# Patient Record
Sex: Female | Born: 1935 | Race: White | Hispanic: No | Smoking: Never smoker
Health system: Southern US, Community
[De-identification: ages and names within clinical notes are randomized; demographics above are authoritative.]

## PROBLEM LIST (undated history)

## (undated) ENCOUNTER — Emergency Department (HOSPITAL_BASED_OUTPATIENT_CLINIC_OR_DEPARTMENT_OTHER): Admission: EM

## (undated) DIAGNOSIS — I10 Essential (primary) hypertension: Secondary | ICD-10-CM

## (undated) DIAGNOSIS — Z98811 Dental restoration status: Secondary | ICD-10-CM

## (undated) DIAGNOSIS — E039 Hypothyroidism, unspecified: Secondary | ICD-10-CM

## (undated) DIAGNOSIS — Z973 Presence of spectacles and contact lenses: Secondary | ICD-10-CM

## (undated) DIAGNOSIS — I499 Cardiac arrhythmia, unspecified: Secondary | ICD-10-CM

## (undated) DIAGNOSIS — M1990 Unspecified osteoarthritis, unspecified site: Secondary | ICD-10-CM

## (undated) HISTORY — PX: EYE SURGERY: SHX253

---

## 1997-09-20 ENCOUNTER — Other Ambulatory Visit: Admission: RE | Admit: 1997-09-20 | Discharge: 1997-09-20 | Payer: Self-pay | Admitting: *Deleted

## 1997-11-08 ENCOUNTER — Ambulatory Visit (HOSPITAL_COMMUNITY): Admission: RE | Admit: 1997-11-08 | Discharge: 1997-11-08 | Payer: Self-pay | Admitting: *Deleted

## 1998-08-10 ENCOUNTER — Emergency Department (HOSPITAL_COMMUNITY): Admission: EM | Admit: 1998-08-10 | Discharge: 1998-08-10 | Payer: Self-pay | Admitting: Emergency Medicine

## 1998-09-25 ENCOUNTER — Other Ambulatory Visit: Admission: RE | Admit: 1998-09-25 | Discharge: 1998-09-25 | Payer: Self-pay | Admitting: *Deleted

## 1999-11-02 ENCOUNTER — Other Ambulatory Visit: Admission: RE | Admit: 1999-11-02 | Discharge: 1999-11-02 | Payer: Self-pay | Admitting: *Deleted

## 2000-08-21 ENCOUNTER — Ambulatory Visit (HOSPITAL_COMMUNITY): Admission: RE | Admit: 2000-08-21 | Discharge: 2000-08-21 | Payer: Self-pay | Admitting: *Deleted

## 2000-08-21 ENCOUNTER — Encounter (INDEPENDENT_AMBULATORY_CARE_PROVIDER_SITE_OTHER): Payer: Self-pay | Admitting: Specialist

## 2000-10-10 ENCOUNTER — Emergency Department (HOSPITAL_COMMUNITY): Admission: EM | Admit: 2000-10-10 | Discharge: 2000-10-10 | Payer: Self-pay | Admitting: Emergency Medicine

## 2000-12-03 ENCOUNTER — Other Ambulatory Visit: Admission: RE | Admit: 2000-12-03 | Discharge: 2000-12-03 | Payer: Self-pay | Admitting: *Deleted

## 2001-05-26 ENCOUNTER — Emergency Department (HOSPITAL_COMMUNITY): Admission: EM | Admit: 2001-05-26 | Discharge: 2001-05-26 | Payer: Self-pay | Admitting: *Deleted

## 2001-12-17 ENCOUNTER — Other Ambulatory Visit: Admission: RE | Admit: 2001-12-17 | Discharge: 2001-12-17 | Payer: Self-pay | Admitting: *Deleted

## 2002-08-02 ENCOUNTER — Emergency Department (HOSPITAL_COMMUNITY): Admission: EM | Admit: 2002-08-02 | Discharge: 2002-08-02 | Payer: Self-pay | Admitting: Emergency Medicine

## 2003-07-20 ENCOUNTER — Emergency Department (HOSPITAL_COMMUNITY): Admission: EM | Admit: 2003-07-20 | Discharge: 2003-07-20 | Payer: Self-pay | Admitting: Emergency Medicine

## 2003-08-02 ENCOUNTER — Emergency Department (HOSPITAL_COMMUNITY): Admission: EM | Admit: 2003-08-02 | Discharge: 2003-08-02 | Payer: Self-pay | Admitting: Emergency Medicine

## 2005-04-01 HISTORY — PX: TOTAL HIP ARTHROPLASTY: SHX124

## 2005-10-07 ENCOUNTER — Ambulatory Visit (HOSPITAL_COMMUNITY): Admission: RE | Admit: 2005-10-07 | Discharge: 2005-10-07 | Payer: Self-pay | Admitting: *Deleted

## 2006-01-09 ENCOUNTER — Inpatient Hospital Stay (HOSPITAL_COMMUNITY): Admission: RE | Admit: 2006-01-09 | Discharge: 2006-01-12 | Payer: Self-pay | Admitting: Orthopedic Surgery

## 2006-03-18 ENCOUNTER — Encounter: Admission: RE | Admit: 2006-03-18 | Discharge: 2006-03-18 | Payer: Self-pay | Admitting: Internal Medicine

## 2006-04-01 HISTORY — PX: TOTAL HIP ARTHROPLASTY: SHX124

## 2006-04-15 ENCOUNTER — Inpatient Hospital Stay (HOSPITAL_COMMUNITY): Admission: RE | Admit: 2006-04-15 | Discharge: 2006-04-19 | Payer: Self-pay | Admitting: Orthopedic Surgery

## 2006-11-14 ENCOUNTER — Encounter: Admission: RE | Admit: 2006-11-14 | Discharge: 2006-11-14 | Payer: Self-pay | Admitting: Orthopaedic Surgery

## 2007-03-23 ENCOUNTER — Encounter: Admission: RE | Admit: 2007-03-23 | Discharge: 2007-03-23 | Payer: Self-pay | Admitting: Internal Medicine

## 2007-04-02 HISTORY — PX: LUMBAR FUSION: SHX111

## 2008-01-14 IMAGING — CR DG CHEST 2V
3 series · 3 of 3 positions shown · non-contrast
Comparison: 08/02/03.

CLINICAL DATA: Scoliosis right hip.  Preop workup. 
 CHEST ?2 VIEW:

[view not recorded (1 of 3)]
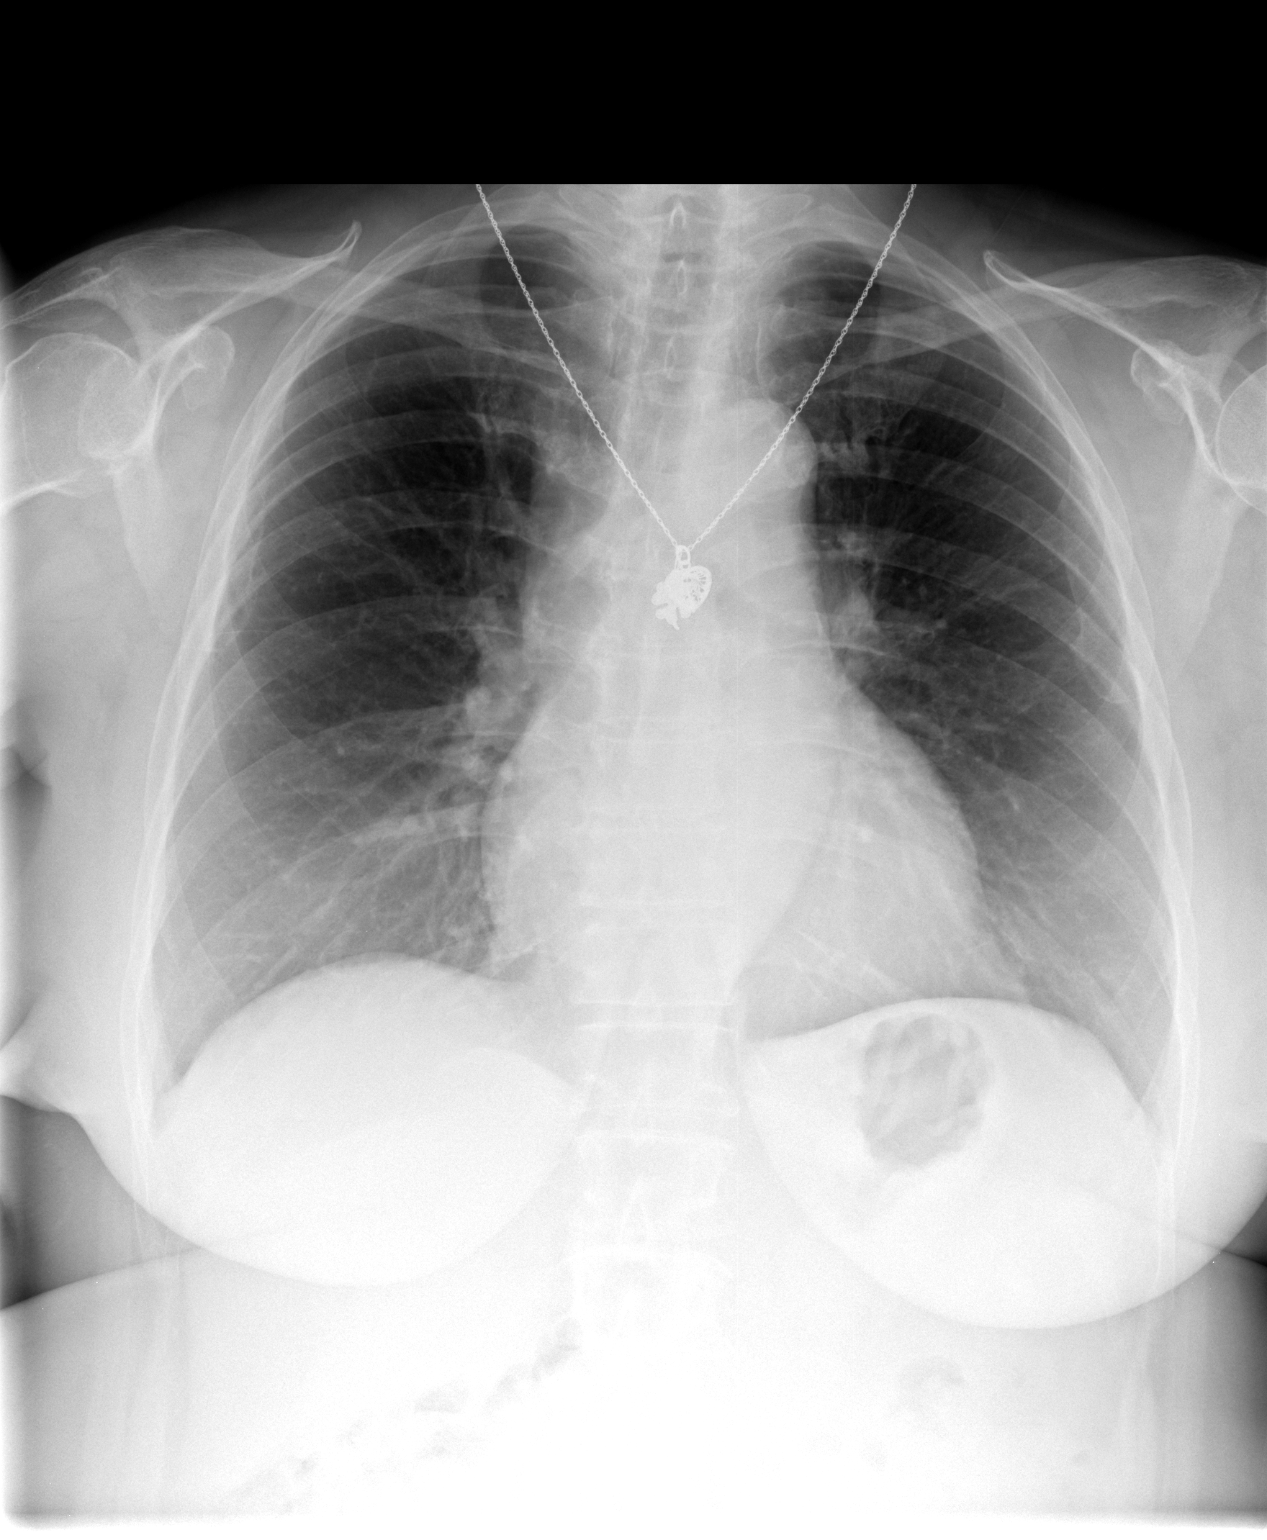

[view not recorded (2 of 3)]
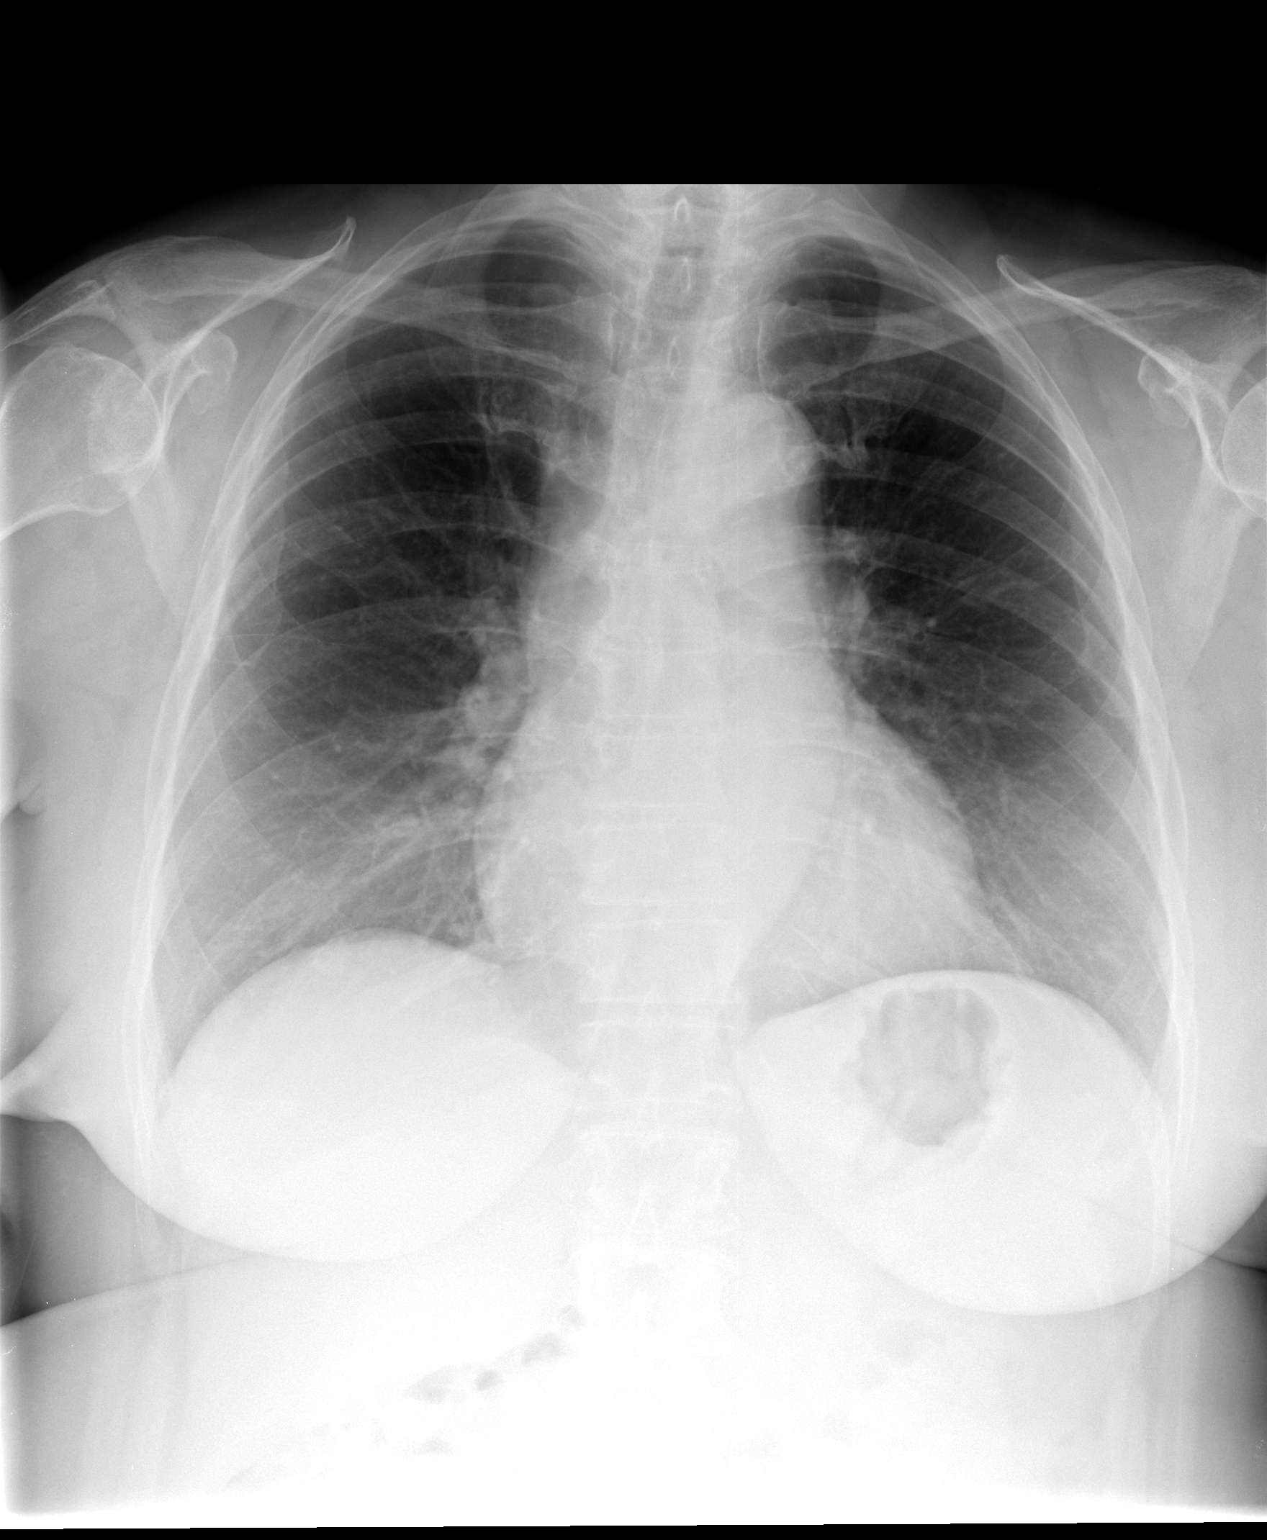

[view not recorded (3 of 3)]
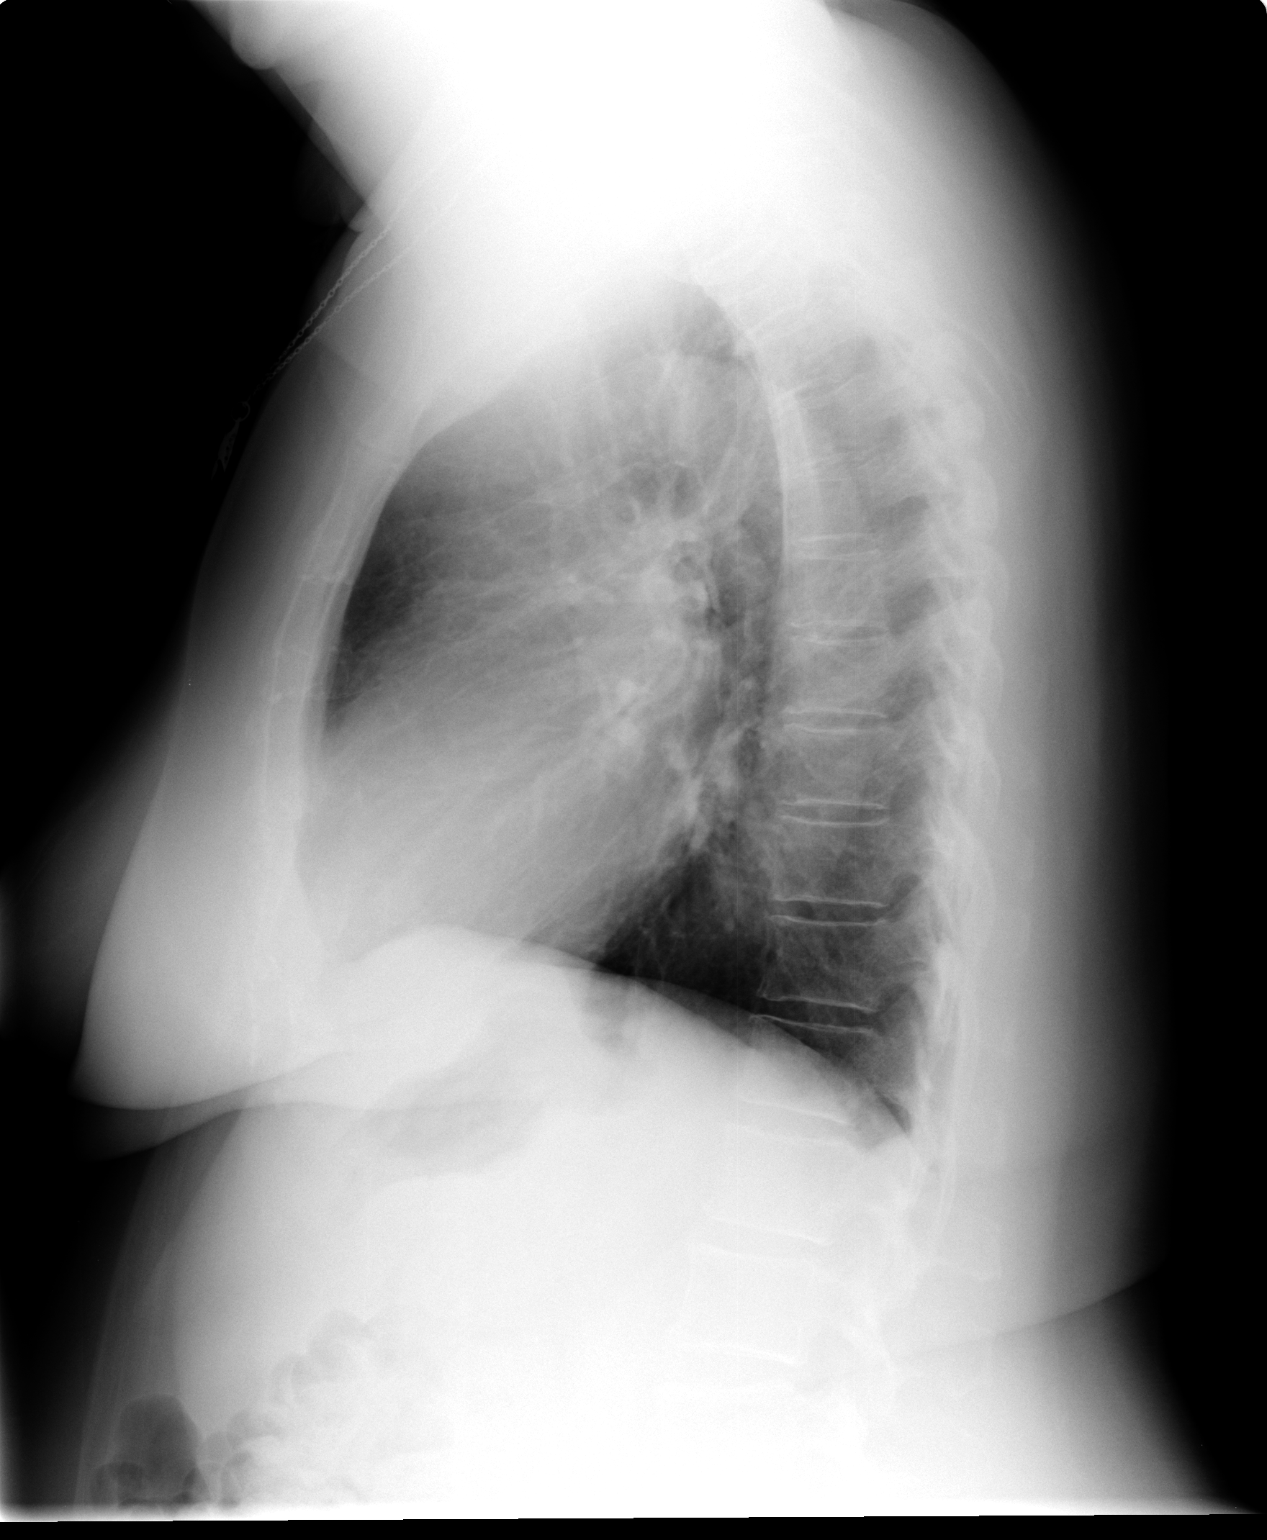

[3 of 3 positions shown; findings below may reference images not displayed]

FINDINGS: The heart size and mediastinal contours are within normal limits.  Both lungs are clear.  The visualized skeletal structures are unremarkable.
IMPRESSION: No active cardiopulmonary disease.

## 2008-01-17 IMAGING — CR DG PORTABLE PELVIS
1 series · 1 of 1 positions shown · non-contrast
Comparison: None.

CLINICAL DATA: Status-post right total hip arthroplasty.
 PORTABLE PELVIS ? 1 VIEW:

[view not recorded]
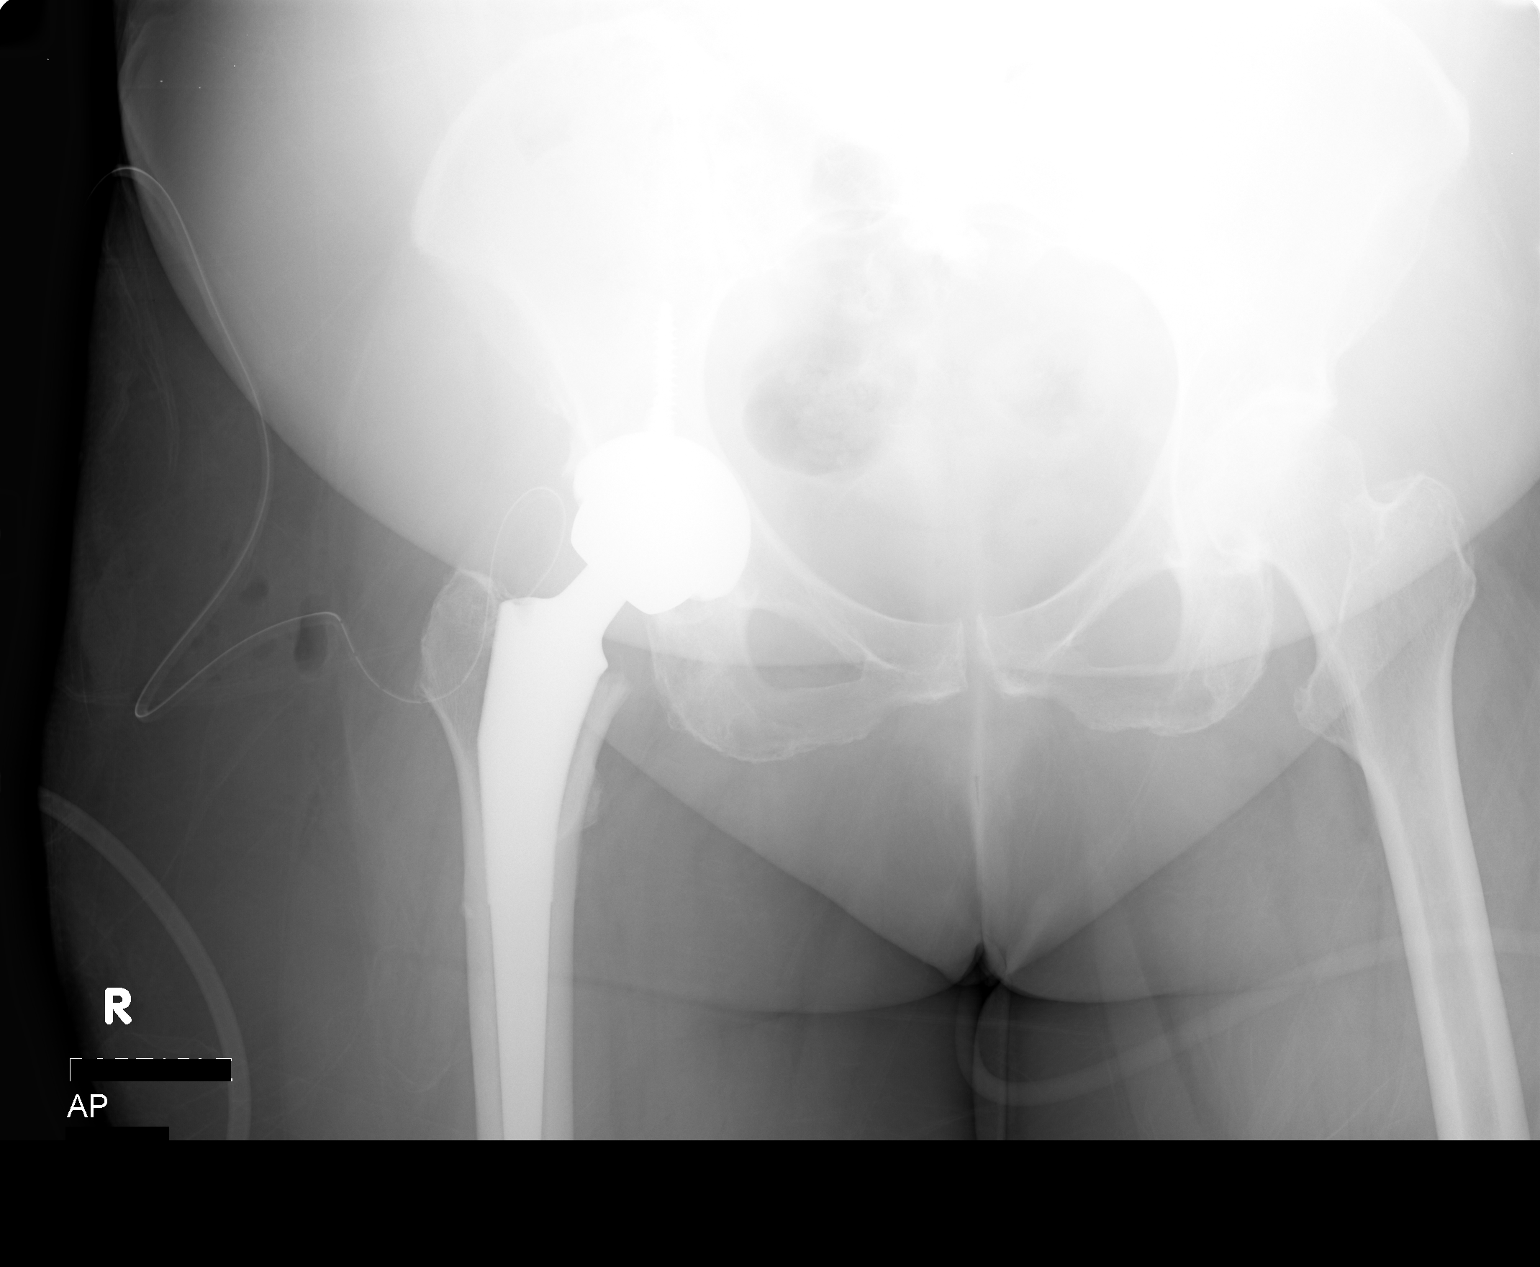

[1 of 1 positions shown; findings below may reference images not displayed]

FINDINGS: There is a RIGHT total hip arthroplasty device in place.  The femoral component is well seated into the acetabular cup. There is a surgical drain within the soft tissues overlying the RIGHT hip.  No complications are identified.
IMPRESSION: Status-post RIGHT total hip arthroplasty without complication.

## 2008-03-23 ENCOUNTER — Encounter: Admission: RE | Admit: 2008-03-23 | Discharge: 2008-03-23 | Payer: Self-pay | Admitting: Internal Medicine

## 2010-01-30 ENCOUNTER — Emergency Department (HOSPITAL_COMMUNITY): Admission: EM | Admit: 2010-01-30 | Discharge: 2010-01-30 | Payer: Self-pay | Admitting: Emergency Medicine

## 2010-03-31 ENCOUNTER — Emergency Department (HOSPITAL_COMMUNITY)
Admission: EM | Admit: 2010-03-31 | Discharge: 2010-03-31 | Payer: Self-pay | Source: Home / Self Care | Admitting: Emergency Medicine

## 2010-04-01 DIAGNOSIS — I499 Cardiac arrhythmia, unspecified: Secondary | ICD-10-CM

## 2010-04-01 HISTORY — PX: ABLATION OF DYSRHYTHMIC FOCUS: SHX254

## 2010-04-01 HISTORY — DX: Cardiac arrhythmia, unspecified: I49.9

## 2010-04-22 ENCOUNTER — Encounter: Payer: Self-pay | Admitting: Internal Medicine

## 2010-06-12 LAB — POCT I-STAT, CHEM 8
BUN: 22 mg/dL (ref 6–23)
Glucose, Bld: 114 mg/dL — ABNORMAL HIGH (ref 70–99)
HCT: 42 % (ref 36.0–46.0)
Hemoglobin: 14.3 g/dL (ref 12.0–15.0)
TCO2: 27 mmol/L (ref 0–100)

## 2010-08-17 NOTE — Op Note (Signed)
Carmen Hammond, Carmen Hammond NO.:  [id number]   MEDICAL RECORD NO.:  [id number]          PATIENT TYPE:  INP   LOCATION:  X006                         FACILITY:  [facility]   PHYSICIAN:  Madlyn Frankel. Charlann Boxer, M.D.  DATE OF BIRTH:  01/23/1929   DATE OF PROCEDURE:  04/15/2006  DATE OF DISCHARGE:                               OPERATIVE REPORT   PREOPERATIVE DIAGNOSIS:  Left hip degenerative joint disease.   POSTOPERATIVE DIAGNOSIS:  Left hip degenerative joint disease.   PROCEDURE:  Left total hip replacement.   COMPONENTS USED:  A DePuy hip system with a size 50 Pinnacle cup, Tri-  Lock 11.3 standard stem and a 36+ 1.5 ball inserted.   SURGEON:  Madlyn Frankel. Charlann Boxer, M.D.   ASSISTANT:  Yetta Glassman. Mann, PA.   ANESTHESIA:  General.   BLOOD LOSS:  400.   DRAINS:  None.   COMPLICATIONS:  None.   INDICATIONS FOR PROCEDURE:  Carmen Hammond is a 77 year old female who  presented to the office for evaluation of bilateral hip pain.  She is  currently status post a right total hip replacement which was performed  on January 09, 2006.  She has done very well with this and is pleased.  Has had progressive discomfort in the left hip and wishes to proceed  with left hip replacement due to the fact that she has had such good  relief with the right hip.  We reviewed the risks and benefits of the  surgery and the expected postoperative course.  Consent was obtained.   PROCEDURE IN DETAIL:  Patient was brought to the operating theater.  Once adequate anesthesia and preoperative antibiotics, 1 gm Ancef, were  administered, the patient was placed in the right lateral decubitus  position with the left side up.  The left lower extremity was then  prepped and draped in a sterile fashion following a pre-scrub.  A  lateral-based incision was made for a posterior approach to the hip.  The iliotibial band and gluteus fascia were incised in line with the  incision for a posterior approach.  Short  external rotators were taken  down to the subcu and the posterior capsule.  An L capsulotomy was made,  preserving the capsule for potential leg repair.   Please note that the superior capsular leaflet ended up being debrided  due to reaming as well as debrided sharply with a long knife blade, so I  was unable to anatomically repair, nonetheless, the posterior leaflet  was used for protection of the sciatic nerve during the case.   Following this, the hip was dislocated.  Preoperatively using the  computer, I calibrated the distance between the center of the femoral  head in relationship to the lesser trochanter on the contralateral hip,  this side had already been performed.  This was assuming that the cup  would be positioned into an anatomic position at the inferior portion of  the teardrop.  The neck osteotomy was made based off the anatomic  landmarks, noting that her femoral head was in the slight valgus  position through the neck and higher  than the tip of the trochanter.  Following the neck osteotomy, attention was first directed to the femur.  Femoral exposure was obtained routinely, followed by orientation of my  anteversion with a box osteotome at 20 degrees of anteversion.  I then  used a straight axial reamer, irrigated the canal up to prevent fat  emboli.  I then began broaching with a 7.3 broach and carried it all the  way up to the 11.3.   At this point, the femoral canal was packed with a sponge, and attention  directed to the acetabulum.  Acetabulum retractors were placed, and  labrectomy carried out.  With this exposure, I then reamed with a 43  reamer down to the medial wall, then sequentially up to a 49 reamer with  excellent bony bed preparation.  There was a cyst in the central portion  of the acetabulum, which I debrided with a curette and packed with a  femoral head bone graft.   At this point, a size 50 Pinnacle cup was then impacted at 40 degrees of   abduction, 20 degrees of forward flexion, to the base of the acetabulum  it was prepared.  Two cancellous bone screws were placed for extra bony  support initially.  I went ahead and placed a final 46 neutral metal  liner, based on the fact that I was happy with the orientation of the  cup.   At this point, attention was redirected back to the femur.  The 11.3 was  then impacted down to the level of the neck, particularly off the medial  aspect of the osteotomy.  I then went ahead and did a trial reduction  with a standard neck and a 36 1.5 ball.   Although the hip felt tight, compared to her download, which I am  relating to the preoperative evaluation, it appeared to be equal at the  anterior aspect of her knees in comparison to the heels and the foot  distally.  She was very stable throughout her range of motion with  internal rotation 80 degrees with neutral abduction and flexion 80  degrees as well as in a sleep position, without impingement with  external rotation.  Combined anteversion was 45-50 degrees.  Given this,  I went ahead and opened the final 11.3 standard stem.   The trial components were removed, and the final 11.3 standard stem was  impacted down to the same level where the broach was, if not 1 mm or  somewhat down.  The 36 + 1.5 ball was then opened and impacted onto a  clean and dry trunnion and reduced to the acetabulum.  The hip was  irrigated throughout the case again at this point.  No Hemovac drain was  used.  Again, the posterior capsular leaflet was excised at this point  due to the fact that the superior leaflet was not available to  reapproximate, and I did not want to place the posterior capsular tissue  into the gluteus minimus.   The iliotibial band was reapproximated using a #1 Ethibond, the gluteus  fascia with a #1 running Vicryl and 2-0 Vicryl were used in the subcu layer and a running 4-0 Monocryl on the skin.  The hip was cleaned,  dried, and  dressed sterilely with Steri-Strips and a silicone dressing.  The patient was extubated and brought to the recovery room in a stable  condition.      Madlyn Frankel Charlann Boxer, M.D.  Electronically Signed  MDO/MEDQ  D:  04/15/2006  T:  04/15/2006  Job:  [id number]

## 2010-08-17 NOTE — Op Note (Signed)
NAMEMONAYE, BLACKIE NO.:  [id number]   MEDICAL RECORD NO.:  [id number]          PATIENT TYPE:  INP   LOCATION:  1503                         FACILITY:  [facility]   PHYSICIAN:  Madlyn Frankel. Charlann Boxer, M.D.  DATE OF BIRTH:  22-Sep-1928   DATE OF PROCEDURE:  01/09/2006  DATE OF DISCHARGE:                                 OPERATIVE REPORT   PREOPERATIVE DIAGNOSIS:  Right hip osteoarthritis.   POSTOPERATIVE DIAGNOSIS:  Right hip osteoarthritis.   PROCEDURE:  Right total hip replacement.   COMPONENTS USED:  DePuy hip system, with size a 50 Pinnacle cup, two  cancellous bone screws, a 36 neutral metal liner.  A Trilock 11.3 standard  stem, with a 36 +1.5 ball.   SURGEON:  Madlyn Frankel. Charlann Boxer, M.D.   ASSISTANTS:  1. Dooley L. Idolina Primer, P.A.-C.  2. Yetta Glassman. Loreta Ave, PA-C.   ANESTHESIA:  General.   BLOOD LOSS:  400.   DRAINS:  x1.   COMPLICATIONS:  None.   INDICATIONS FOR PROCEDURE:  Ms. Pho is a 77 year old female whom I have  been following in the office for a combination of some lower back radicular  complaints well as bilateral hip osteoarthritis.  We had performed an  injection to the right hip which had significantly reduced her symptoms.  We  had offered further conservative care, but she wished for surgical  intervention.  The risks and benefits of neurovascular injury, DVT,  dislocation, component failure, and need for revision were all reviewed.  Consent obtained.   PROCEDURE IN DETAIL:  The patient was brought to operative theater.  Once  adequate anesthesia and preoperative antibiotics, 1 g of Ancef, were  administered, the patient was positioned in the left lateral decubitus  position with the right side up.  Right lower extremity was then prepped and  draped in a sterile fashion.  A lateral-based incision was made for a  posterior approach to the hip.  Short external rotators were taken down and  separated from the posterior capsule which was saved for  later repair.   The hip was dislocated, and a neck osteotomy was made based off anatomic  landmarks and preoperative templating based on the tip of the trochanter in  relation to the center of the head.   Note that I did evaluate the patient's comparison of the operative leg to  the down leg preoperatively in the lateral position in order to determine  and help assess for leg lengths.   Following the neck osteotomy, attention was directed to the femur.  Femoral  exposure was obtained routinely using a box osteotome and directed the  femoral anteversion to about 20-25 degrees.   Then, with a single straight hand reamer, I reamed the canal and irrigated  it to prevent fat emboli.  I then broached up sequentially to an 11.3, which  got me to the level where the tip of the trochanter went to the center of  the head.   The broach was removed, and attention was directed to the acetabular  component.  Acetabular exposure was obtained routinely, including  labrectomy.  In order for exposure, some of the superior capsule was  excised.  Reaming commenced with a 43 reamer and carried up sequentially to  47 reamer, and then by 2s, reamed by 1 to 48, and then 49 reamers.  Reaming  went without complication.  He had good bony bed preparation.  There was a  cyst anteriorly that I debrided and packed with bone graft from the femoral  head and neck.   The final 50 mm Pinnacle cup was then impacted with an excellent scratch  fit.  The position of the cup was about 40 degrees of abduction and 20  degrees of forward flexion.  It was anatomically placed beneath the anterior  wall.  Two cancellous bone screws were placed, and a 36 neutral liner  placed, a trial.   At this point, attention was redirected back to the femur.  The 11.3 broach  was impacted the level where it was done before.  Standard neck and 36 +1.5  ball were placed.  Hip was reduced. Combined anteversion appeared to be  about 45-50  degrees. The hip was very stable, with flexion, neutral  abduction, internal rotation to 70-80 degrees.  There was about 1 mm of  shuck. The leg lengths appeared to be exactly the same as what they were  preoperatively in relationship to the heels and the knee.   There was no impingement on external and internal rotation.  Following this,  the trial components were removed. The final 36 neutral metal liner was then  impacted without complication following placement of the hole eliminator.  The final 11.3 standard stem was then impacted to the level where the broach  was.   I did try, and confirmed that with the trial 36 -2 ball, that the leg  lengths appeared to be a bit shorter and there was more shuck.  For this  reason, I went with a 36 +1.5 ball.  It was trialed.  This was impacted onto  a clean and dry trunnion and the hip reduced.  The hip was irrigated  throughout the case.  I placed a Hemovac drain deep.  I then reapproximated  the posterior capsule around the rim of the cup with a #1 Vicryl but was  unable to get much more that due to some the superior capsulectomy.  The  iliotibial band was reapproximated using #1 Ethibond, the gluteal fascia  with #1 Vicryl.  The remainder of the wound was closed in layers with 2-0  Vicryl and a running 4-0 Monocryl.  The patient's hip was cleaned, dried,  and dressed sterilely with Steri-Strips, dressing sponges, and  tape.  She  was brought to recovery room extubated, in stable condition.      Madlyn Frankel Charlann Boxer, M.D.  Electronically Signed     MDO/MEDQ  D:  01/09/2006  T:  01/10/2006  Job:  [id number]

## 2010-08-17 NOTE — Discharge Summary (Signed)
NAMELAVELL, Hammond NO.:  [id number]   MEDICAL RECORD NO.:  [id number]          PATIENT TYPE:  INP   LOCATION:  1503                         FACILITY:  [facility]   PHYSICIAN:  Madlyn Frankel. Charlann Boxer, M.D.  DATE OF BIRTH:  14-Nov-1928   DATE OF ADMISSION:  01/09/2006  DATE OF DISCHARGE:  01/12/2006                                 DISCHARGE SUMMARY   ADMISSION DIAGNOSES:  1. Osteoarthritis.  2. Cardiac dysrhythmias.   DISCHARGE DIAGNOSES:  1. Osteoarthritis.  2. Cardiac dysrhythmias.   PROCEDURE:  The patient was admitted to [facility] for a right  total hip replacement.   SURGEON:  Madlyn Frankel. Charlann Boxer, M.D.   FIRST ASSISTANT:  Dooley L. Idolina Primer, P.A.-C. and Yetta Glassman. Mann, P.A.-C.   ANESTHESIA:  General.   CONSULTATIONS:  None, other than PT/OT care management.   BRIEF HISTORY:  Carmen Hammond is a 77 year old female with a history of  bilateral hip pain.  It has progressively increased in pain over the past  year where it was increasingly difficult to maintain activities of daily  living, and it has been extremely difficult to walk without significant  pain.  After conservative management and considerations, Carmen Hammond decided  to go ahead with something more definitive and elected for right hip  replacement.   LABORATORY DATA:  Complete blood count preadmission, hemoglobin 13.3,  hematocrit 38.6.  Upon discharge, hemoglobin 9.5, hematocrit 27.6, white  blood cells 10.9.  Preadmission white cell differential within normal  limits.  Preadmission coagulation within normal limits.  Preadmission  chemistry, glucose 102.  Upon discharge, glucose 111.  Preadmission  urinalysis negative.  Preadmission blood bank testing showed her to be A  positive.   Preadmission chest x-ray showed heart size within normal limits.  Both lungs  were clear.  No active cardiopulmonary disease.  Preadmission EKG showed  normal sinus rhythm.   HOSPITAL COURSE:  The patient  tolerated the procedure well and was admitted  to [facility] orthopedic floor.  The patient began physical therapy within  24 hours where she was able to move from her bed to chair to bathroom.  During her stay, she progressed well with physical therapy.  Physical  therapy was able to get her to goal of independent transfer from bed to  chair with weightbearing as tolerated.  She was able with physical therapy  to progress to walking up to 200 feet by discharge date with a rolling  walker with 25% weightbearing.  Her pain was controlled with medications to  the patient's satisfaction where she was able to tolerate the procedure  well.  Her wound was kept clean and dry.   On postoperative day #1, her Hemovac was pulled.  On postoperative day #2,  dressing was changed.  The wound was clean and dry.  The incision showed no  signs of infection or erythema.  Note, the patient did receive a full course  of antibiotics.  The patient progressed nicely with physical therapy.  She  tolerated all medications and wound dressing changes well.  She was  neuromuscularly intact throughout her  course of stay.  She was begun on a  Lovenox blood thinner after 24 hours for DVT prophylaxis.  After 3 days, the  patient was well, ready to go home, able to ambulate and transfer herself  from bed to walker, as mentioned.   CONDITION ON DISCHARGE:  Stable and improved.   DISCHARGE DIET:  As tolerated.  Regular diet by patient.   DISCHARGE MEDICATIONS:  1. Home medications.  2. Lovenox 40 mg subcutaneously q.24h.  3. Robaxin 500 mg 1 to 2 p.o. q.4-6h. p.r.n. muscle spasm/pain.  4. Norco 5/325 1 to 2 p.o. q.4-6h. p.r.n. pain.  5. Colace 100 mg 1 p.o. b.i.d. p.r.n. constipation.  6. Iron 325 mg x3 daily.   DISCHARGE PLANS:  The patient will continue with physical therapy as  recommended by physical therapy assessment at 7 times per week.  The primary  goals of rehabilitation will be to maximize the range of  motion, minimize  pain, improve muscle strength, promote ambulation, and encourage  independence in performing the activities of daily living.   FOLLOW UP:  The patient will follow up with Dr. Charlann Boxer within 10 days for  removal of sutures and standard postoperative check.  Dr. Nilsa Nutting phone  number is [phone].  She can return to our office if she develops any fever  or chills.  If she develops any respiratory distress or calf pain, she  should call emergency services immediately.     ______________________________  Yetta Glassman Loreta Ave, [place]      Madlyn Frankel. Charlann Boxer, M.D.  Electronically Signed    BLM/MEDQ  D:  01/23/2006  T:  01/24/2006  Job:  [id number]

## 2010-08-17 NOTE — Discharge Summary (Signed)
[name], Carmen NO.:  [id number]   MEDICAL RECORD NO.:  [id number]          PATIENT TYPE:  INP   LOCATION:  1613                         FACILITY:  [facility]   PHYSICIAN:  Madlyn Frankel. Charlann Boxer, M.D.  DATE OF BIRTH:  10-Mar-1929   DATE OF ADMISSION:  04/15/2006  DATE OF DISCHARGE:  04/19/2006                               DISCHARGE SUMMARY   ADMITTING DIAGNOSES:  1. Osteoarthritis.  2. Hypertension.  3. Cardiac dysrhythmia.   DISCHARGE DIAGNOSES:  1. Osteoarthritis.  2. Hypertension.  3. Cardiac dysrhythmia.  4. Postoperative acute blood loss anemia.   CONSULTS:  Pharmacy for Coumadin.   PROCEDURE:  Left total hip replacement.  Surgeon Dr. Durene Romans.  Assistant Dwyane Luo, PA-C.   HISTORY OF PRESENT ILLNESS:  Carmen Hammond is a very pleasant 77 year old  female with a history of bilateral hip pain.  She recently had a right  total hip replacement performed.  She has the same degenerative changes  in her left hip.  She has been refractory to all conservative treatment  and has diminished quality of life.  She was scheduled for a left total  hip replacement by surgeon Dr. Durene Romans.   Preadmission labs showed her hemoglobin and hematocrit to be  respectively 12.3 and 36.3. During the course of stay, postop day #1  hemoglobin 9.3, hematocrit 27.3; postop day #2 hemoglobin 8.6,  hematocrit 25.7; postop day #3  hemoglobin 7.4, hematocrit 22. 1. She  was transfused 2 units of packed red blood cells, her hemoglobin  stabilized afterwards at 9.3, hematocrit 27.0.   Preadmission differential within normal limits.   Preadmission coagulation INR 0.9. After Coumadin was started it tracked  upwards to 1.1 to 1.6 to 1.7 to 2.1 on a daily measured basis.  It was  stable at 2.1 on April 19, 2006 prior to discharge.   Routine chemistry showed sodium 136, potassium 4.6, glucose 91 upon  admission and tracked throughout her course of stay.  Upon discharge,  she was  stable with a sodium of 138, potassium 3.9, glucose 125.   Preadmission urinalysis showed it was negative. During her course of  stay, she had some chest pain.  Cardiac markers were taken which showed  that she had a persistently increased troponin of 0.45 related to some  unstable angina, congestive heart failure or COPD. Recommended clinical  follow-up with cardiology afterwards.   Preadmission EKG showed normal sinus rhythm.  She was cleared by  [facility].  Her chest two-view showed no active  cardiopulmonary disease.   PREADMISSION CLEARANCE:  [facility] obtained  on September19,2007.   HOSPITAL COURSE:  The patient underwent left total hip replacement,  tolerated procedure well and was admitted on the orthopedic floor. The  pain is well-controlled throughout her course of stay.  Dressing was  changed on a daily basis after postop day #1.  Postop day #1 she did  have a temperature 101.3.  She was neuromuscular and vascularly stable.  PT and OT was begun with partial weightbearing 50% with the use of a  rolling walker.  PT note that was documented in chart recommended that  she have some home health care with the use of a rolling walker, bedside  commode and shower stool.  Pharmacy Coumadin management was begun on  postop day #1.  As stated in the labs it was stable and at 2.1 upon her  discharge.   Postop day #2 she was doing well, no respiratory distress, dressing was  changed, her wound  was intact, no ooze, no active drainage.  Neuromuscular and vascularly intact.  Partial weightbearing 50%  continued. We increased her IV solution to 75 mL an hour of normal  saline. OT was noted that her weightbearing status was increased to  weightbearing as tolerated.  The patient did not need any further  occupational therapy.  On April 18, 2006 at 0500, the patient stated  she was having some rapid heart beat. Her pulse was 160,  respirations  20, blood pressure 140/80. PA was notified. She was placed on O2 at 100%  2 liters nasal cannula. She denied any shortness of breath, denied any  chest pain but her color was pale, her skin was warm and dry. She was  calm. Inderal 40 mg p.o. was given to the patient. Rapid response was  actually calm. She did have some SVT with rates in the 160s. Cardiac  enzymes and EKG were ordered. Repeat Inderal p.o. dosing 20 minutes. The  patient reponded to a Valsalva maneuver. The patient stabilized. January  18, she had no further complaints when we saw her in the morning. Her  vital signs were stable at that point. She had a regular rate and rhythm  with palpation that morning. She did have some postoperative blood loss  anemia, hemoglobin 7.4. That is when we transfused her 2 packed units of  packed red blood cells. We delayed her discharge at least for 1 day so  we could recheck her labs. Coumadin again was given. On the next day,  she was stable orthopedically, hemodynamically no further complaints.  She was ready for discharge home with the use of a rolling walker and  home health care physical therapy.   DISCHARGE MEDICATIONS:  1. Coumadin per pharmacy 0.4 mg daily.  2. Vicodin 5/100 1-2 p.o. q. 4-6 p.r.n. pain.  3. Robaxin 500 mg 1 p.o. q. 6 p.r.n. muscle spasms.  4. Colace 100 mg 1 p.o. b.i.d. p.r.n. constipation.  5. Iron sulfate 325 mg 1 p.o. b.i.d. x3 weeks.  6. Toprol XL 50 mg 1 p.o. q.h.s.  7. Propranolol 40 mg 1 p.o. p.r.n. tachycardia.  8. Multivitamin.  9. Fish oil.  10.Calcium.  11.Vitamin D.   DISCHARGE WOUND CARE:  Keep wound dry.  Change dressing on a daily  basis.   DISCHARGE PHYSICAL THERAPY:  The patient is weightbearing as tolerated  with the use of a rolling walker.  Transition to a single point cane  after 2 weeks as balance and strength increases. The goals of physical therapy are going to be to maximize strength, minimize pain, increase  range  of motion and encourage independence in activities of daily  living.   DISCHARGE FOLLOWUP:  Follow up with Dr. Charlann Boxer at [phone] in 2 weeks for  a wound care check.  If the patient develops any acute shortness of  breath or severe calf pain please call emergency services immediately.     ______________________________  Yetta Glassman [address]oreta [address], [place]      Madlyn Frankel. Charlann Boxer, M.D.  Electronically Signed  BLM/MEDQ  D:  06/06/2006  T:  06/06/2006  Job:  [id number]

## 2010-08-17 NOTE — H&P (Signed)
NAMEMITSUE, PEERY NO.:  [id number]   MEDICAL RECORD NO.:  [id number]          PATIENT TYPE:  INP   LOCATION:  NA                           FACILITY:  [facility]   PHYSICIAN:  Madlyn Frankel. Charlann Boxer, M.D.  DATE OF BIRTH:  17-Mar-1929   DATE OF ADMISSION:  DATE OF DISCHARGE:                                HISTORY & PHYSICAL   CHIEF COMPLAINT:  Right hip pain.   HISTORY OF PRESENT ILLNESS:  Carmen Hammond is a very pleasant 77 year old  female with a history of bilateral hip pain.  It has progressively increased  in pain over the past year to the point it has become increasingly difficult  to maintain activities of daily living, and has been extremely difficult to  walk without significant pain.  After conservative management and  considerations, Carmen Hammond decided to go ahead with something more  definitive and elected for right hip replacement.   PAST HISTORY:  1. Carmen Hammond has had 4 cesarean sections.  2. Periodic SVT.   FAMILY HISTORY:  Has a history of heart disease.   SOCIAL HISTORY:  Carmen Hammond lives with Carmen Hammond children who will be helping with Carmen Hammond  care giving afterwards.   DRUG ALLERGIES:  Carmen Hammond has no known drug allergies, no known food allergies.   MEDICATIONS:  1. Toprol.  2. Propranolol p.r.n.   REVIEW OF SYSTEMS:  No new chest pain, shortness of breath, abdominal pain,  diarrhea, constipation, headache.  No changes in vision.  Carmen Hammond has had some  increase in urinary frequency with some signs and symptoms of some urge  incontinence.   PHYSICAL EXAMINATION:  VITAL SIGNS:  Temperature was 98.3, pulse was 76,  respirations 18, blood pressure 152/80.  GENERAL:  Awake, alert and oriented.  Answers all questions appropriately.  Carmen Hammond is well-developed, well-nourished, and answered all questions.  NECK:  Supple.  No lymphadenopathy, no carotid bruits were noted.  CHEST:  Carmen Hammond lungs were clear bilaterally.  No rhonchi, wheezes or rales.  BREASTS:  Deferred.  HEART:  Regular rate  and rhythm.  No gallops, clicks, rubs or murmurs.  S1  and S2 distinct.  No splitting noted.  ABDOMEN:  Soft, nontender, nondistended.  Bowel sounds were present in all 4  quadrants.  GENITOURINARY:  Deferred.  EXTREMITIES:  Carmen Hammond has some gait disruption.  Carmen Hammond has pain with internal  rotation of Carmen Hammond right hip.  SKIN:  No rashes, no skin breakdown.  No open sores.  NEUROLOGIC:  Carmen Hammond is intact with distal sensibilities.  Also dorsalis pedis  pulse intact.   LABORATORY DATA:  Awaiting [facility] preoperative labs on January 06, 2006.   X-RAYS:  X-rays have been done of the right hip including pelvis.   IMPRESSION:  Right hip end-stage osteoarthritis.   PLAN OF ACTION:  Perform a right total hip arthroplasty on January 09, 2006  by Dr. Durene Romans.  All risks and complications were discussed during Carmen Hammond  history and physical.  All questions were encouraged and answered to Carmen Hammond  satisfaction.     ______________________________  Yetta Glassman [address], [place]  Madlyn Frankel Charlann Boxer, M.D.  Electronically Signed    BLM/MEDQ  D:  01/01/2006  T:  01/02/2006  Job:  [id number]   cc:   Juline Patch, M.D.  Fax: [phone]   Duke Salvia, MD, [facility]  [address]  [address]  [place]  [place] [address]

## 2010-08-17 NOTE — Op Note (Signed)
NAMESANGEETA, YOUSE NO.:  [id number]   MEDICAL RECORD NO.:  [id number]          PATIENT TYPE:  AMB   LOCATION:  ENDO                         FACILITY:  [facility]   PHYSICIAN:  [place] Spinner, M.D.    DATE OF BIRTH:  [birth year]   DATE OF PROCEDURE:  10/07/2005  DATE OF DISCHARGE:                                 OPERATIVE REPORT   PROCEDURE:  Colonoscopy.   INDICATIONS:  Colon polyps.   ANESTHESIA:  Demerol 70 mg, Versed 6 mg.   PROCEDURE:  With the patient mildly sedated in the left lateral decubitus  position, the Olympus videoscopic colonoscope was inserted in the rectum and  passed under direct vision.  With pressure applied to the abdomen and  patient rolled to her back, we were able to reach the cecum identified by  ileocecal valve and appendiceal orifice, both which were photographed.  From  this point, the colonoscope was slowly withdrawn taking circumferential  views of colonic mucosa stopping only in the rectum which appeared normal on  direct and showed hemorrhoids on retroflexed view.  The endoscope was  straightened and withdrawn.  The patient's vital signs and pulse oximeter  remained stable.  The patient tolerated the procedure well without apparent  complications.   FINDINGS:  Internal hemorrhoids.  Otherwise unremarkable colonoscopic  examination to the cecum.   PLAN:  Have patient follow-up with me in 5 years or as needed.           ______________________________  [place]iana Spinner, M.D.     GMO/MEDQ  D:  10/07/2005  T:  10/07/2005  Job:  [id number]

## 2010-08-17 NOTE — Procedures (Signed)
[facility]  Patient:    Carmen Hammond, Carmen Hammond                     MRN: [id number] Proc. Date: 08/21/00 Adm. Date:  [id number] Disc. Date: [id number] Attending:  Sabino Gasser                           Procedure Report  PROCEDURE:  Colonoscopy with biopsy.  INDICATIONS FOR PROCEDURE:  Colon polyps.  ANESTHESIA:  Demerol 50, Versed 6 mg.  DESCRIPTION OF PROCEDURE:  With the patient mildly sedated in the left lateral decubitus position, the Olympus videoscopic pediatric variable stiffness colonoscope was inserted in the rectum and passed under direct vision to the cecum identified by the ileocecal valve and appendiceal orifice. There was a small polyp in the cecum which was removed using hot biopsy forceps technique on a setting of 20:20 blended current. The colonoscope was then slowly withdrawn taking circumferential views of the entire colonic mucosa withdrawing all the way to the rectum, stopping only at ______ cm from the anal verge at which point a second polyp was seen, photographed and removed using hot biopsy forceps technique. The endoscope was pulled back to the rectum which appeared normal in direct view and showed internal hemorrhoids on retroflexed view. The patients vital signs and pulse oximeter remained stable. The patient tolerated the procedure well without apparent complications.  FINDINGS:  Two small polyps removed. Await biopsy report. The patient will call me for results and followup with me in as an outpatient. Internal hemorrhoids were also seen. DD:  08/21/00 TD:  08/21/00 Job: [id number]

## 2010-08-17 NOTE — H&P (Signed)
NAMECOLENA, KETTERMAN NO.:  [id number]   MEDICAL RECORD NO.:  [id number]         PATIENT TYPE:  LINP   LOCATION:  1613                         FACILITY:  [facility]   PHYSICIAN:  Madlyn Frankel. Charlann Boxer, M.D.  DATE OF BIRTH:  1928-11-29   DATE OF ADMISSION:  04/15/2006  DATE OF DISCHARGE:                              HISTORY & PHYSICAL   CHIEF COMPLAINT:  Left hip pain.   HISTORY OF PRESENT ILLNESS:  This is a 77 year old female with history  of bilateral hip pain and degenerative changes.  She had a recent right  hip replacement in October 2007.  She has done extremely well since the  right hip placement, although her left hip continues to give her  persistent pain.  She has tried all conservative measures which have  failed to provide relief which have included a antiinflammatories as  well steroid injections.  Due to her diminished quality of life, she has  elected to proceed forward with bilateral total hip replacements, this  one being the left total hip replacement.   PAST MEDICAL HISTORY:  1. Hypertension.  2. Cardiac dysrhythmia.   PAST SURGICAL HISTORY:  Four cesarean sections.   FAMILY HISTORY:  Heart disease.   SOCIAL HISTORY:  Children are primary caregivers.   DRUG ALLERGIES:  NO KNOWN DRUG ALLERGIES.  NO KNOWN FOOD ALLERGIES.   MEDICATIONS:  Toprol, propranolol.   REVIEW OF SYSTEMS:  No new signs or symptoms of any cardiovascular,  respiratory, gastrointestinal, genitourinary, neurological, or  musculoskeletal system complaints.   PHYSICAL EXAMINATION:  VITAL SIGNS:  Temperature is 98.4, pulse 76,  respirations 18, blood pressure 146/74.  GENERAL:  She is awake, alert, and oriented, well developed, well  nourished, in no acute distress.  NECK:  Has no carotid bruits.  CHEST:  Lungs clear to auscultation bilaterally.  BREASTS:  Deferred.  HEART: Regular rate and rhythm, without gallops, clicks, rubs, or  murmurs.  ABDOMEN:  Soft, nontender,  nondistended.  Bowel sounds present in all  four quadrants.  GENITOURINARY:  Deferred.  EXTREMITIES: Painful left hip with range of motion.  Antalgic gait, with  single-point cane.  SKIN: No cellulitis.  PULSES:  Dorsalis pedis pulse positive.  NEUROLOGIC: Intact to sensibilities distal.   LABORATORIES:  Pending.   X-RAYS:  Pending.   IMPRESSION:  Left hip osteoarthritis.   PLAN OF ACTION:  Left total hip replacement on April 15, 2006 by  surgeon Dr. Durene Romans.  Risks and complications were discussed.  Questions were encouraged, answered, and reviewed.  We look forward to  treating this very pleasant 77 year old female.     ______________________________  Yetta Glassman. [address], [place]      Madlyn Frankel. Charlann Boxer, M.D.  Electronically Signed    BLM/MEDQ  D:  03/26/2006  T:  03/26/2006  Job:  [id number]

## 2011-07-26 ENCOUNTER — Other Ambulatory Visit: Payer: Self-pay | Admitting: Internal Medicine

## 2011-07-26 DIAGNOSIS — Z1231 Encounter for screening mammogram for malignant neoplasm of breast: Secondary | ICD-10-CM

## 2011-08-06 ENCOUNTER — Ambulatory Visit
Admission: RE | Admit: 2011-08-06 | Discharge: 2011-08-06 | Disposition: A | Discharge status: Home / Self Care | Payer: Medicare Other | Source: Ambulatory Visit | Attending: Internal Medicine | Admitting: Internal Medicine

## 2011-08-06 DIAGNOSIS — Z1231 Encounter for screening mammogram for malignant neoplasm of breast: Secondary | ICD-10-CM

## 2013-08-06 ENCOUNTER — Other Ambulatory Visit: Payer: Self-pay

## 2013-08-06 DIAGNOSIS — Z1231 Encounter for screening mammogram for malignant neoplasm of breast: Secondary | ICD-10-CM

## 2013-08-10 ENCOUNTER — Other Ambulatory Visit: Payer: Self-pay | Admitting: Orthopedic Surgery

## 2013-08-16 ENCOUNTER — Encounter (HOSPITAL_BASED_OUTPATIENT_CLINIC_OR_DEPARTMENT_OTHER): Payer: Self-pay | Admitting: *Deleted

## 2013-08-16 NOTE — Progress Notes (Signed)
Pt very sharp-had labs recent pcp-had a cardiac ablation [facility] 2012-may need ekg if not newest in 12 months

## 2013-08-20 ENCOUNTER — Ambulatory Visit (HOSPITAL_BASED_OUTPATIENT_CLINIC_OR_DEPARTMENT_OTHER): Payer: Medicare Other | Admitting: Anesthesiology

## 2013-08-20 ENCOUNTER — Encounter (HOSPITAL_BASED_OUTPATIENT_CLINIC_OR_DEPARTMENT_OTHER): Payer: Medicare Other | Admitting: Anesthesiology

## 2013-08-20 ENCOUNTER — Ambulatory Visit (HOSPITAL_BASED_OUTPATIENT_CLINIC_OR_DEPARTMENT_OTHER)
Admission: RE | Admit: 2013-08-20 | Discharge: 2013-08-20 | Disposition: A | Discharge status: Home / Self Care | Payer: Medicare Other | Source: Ambulatory Visit | Attending: Orthopedic Surgery | Admitting: Orthopedic Surgery

## 2013-08-20 ENCOUNTER — Encounter (HOSPITAL_BASED_OUTPATIENT_CLINIC_OR_DEPARTMENT_OTHER)
Admission: RE | Disposition: A | Discharge status: Home / Self Care | Payer: Self-pay | Source: Ambulatory Visit | Attending: Orthopedic Surgery

## 2013-08-20 ENCOUNTER — Encounter (HOSPITAL_BASED_OUTPATIENT_CLINIC_OR_DEPARTMENT_OTHER): Payer: Self-pay | Admitting: *Deleted

## 2013-08-20 DIAGNOSIS — Z7982 Long term (current) use of aspirin: Secondary | ICD-10-CM | POA: Insufficient documentation

## 2013-08-20 DIAGNOSIS — M19049 Primary osteoarthritis, unspecified hand: Secondary | ICD-10-CM | POA: Insufficient documentation

## 2013-08-20 DIAGNOSIS — Z885 Allergy status to narcotic agent status: Secondary | ICD-10-CM | POA: Insufficient documentation

## 2013-08-20 DIAGNOSIS — G5600 Carpal tunnel syndrome, unspecified upper limb: Secondary | ICD-10-CM | POA: Insufficient documentation

## 2013-08-20 DIAGNOSIS — Z79899 Other long term (current) drug therapy: Secondary | ICD-10-CM | POA: Insufficient documentation

## 2013-08-20 DIAGNOSIS — I10 Essential (primary) hypertension: Secondary | ICD-10-CM | POA: Insufficient documentation

## 2013-08-20 DIAGNOSIS — E039 Hypothyroidism, unspecified: Secondary | ICD-10-CM | POA: Insufficient documentation

## 2013-08-20 HISTORY — DX: Dental restoration status: Z98.811

## 2013-08-20 HISTORY — DX: Hypothyroidism, unspecified: E03.9

## 2013-08-20 HISTORY — DX: Essential (primary) hypertension: I10

## 2013-08-20 HISTORY — PX: CARPAL TUNNEL RELEASE: SHX101

## 2013-08-20 HISTORY — DX: Presence of spectacles and contact lenses: Z97.3

## 2013-08-20 HISTORY — DX: Cardiac arrhythmia, unspecified: I49.9

## 2013-08-20 HISTORY — DX: Unspecified osteoarthritis, unspecified site: M19.90

## 2013-08-20 LAB — POCT I-STAT, CHEM 8
BUN: 17 mg/dL (ref 6–23)
CALCIUM ION: 1.09 mmol/L — AB (ref 1.13–1.30)
CHLORIDE: 105 meq/L (ref 96–112)
Creatinine, Ser: 0.9 mg/dL (ref 0.50–1.10)
GLUCOSE: 92 mg/dL (ref 70–99)
HCT: 42 % (ref 36.0–46.0)
Hemoglobin: 14.3 g/dL (ref 12.0–15.0)
Potassium: 4 mEq/L (ref 3.7–5.3)
Sodium: 141 mEq/L (ref 137–147)
TCO2: 27 mmol/L (ref 0–100)

## 2013-08-20 SURGERY — CARPAL TUNNEL RELEASE
Anesthesia: General | Site: Hand | Laterality: Left

## 2013-08-20 MED ORDER — ONDANSETRON HCL 4 MG/2ML IJ SOLN
INTRAMUSCULAR | Status: DC | PRN
  Administered 2013-08-20: 4 mg via INTRAVENOUS

## 2013-08-20 MED ORDER — PROPOFOL 10 MG/ML IV BOLUS
INTRAVENOUS | Status: DC | PRN
  Administered 2013-08-20: 100 mg via INTRAVENOUS

## 2013-08-20 MED ORDER — LACTATED RINGERS IV SOLN
INTRAVENOUS | Status: DC
  Administered 2013-08-20: 12:00:00 via INTRAVENOUS

## 2013-08-20 MED ORDER — CHLORHEXIDINE GLUCONATE 4 % EX LIQD: 60.0000 mL | Freq: Once | CUTANEOUS | Status: DC

## 2013-08-20 MED ORDER — BUPIVACAINE HCL (PF) 0.25 % IJ SOLN
INTRAMUSCULAR | Status: AC
  Filled 2013-08-20: qty 30

## 2013-08-20 MED ORDER — FENTANYL CITRATE 0.05 MG/ML IJ SOLN
INTRAMUSCULAR | Status: DC | PRN
  Administered 2013-08-20: 50 ug via INTRAVENOUS

## 2013-08-20 MED ORDER — FENTANYL CITRATE 0.05 MG/ML IJ SOLN
50.0000 ug | INTRAMUSCULAR | Status: DC | PRN
Start: 2013-08-20 — End: 2013-08-20

## 2013-08-20 MED ORDER — CEFAZOLIN SODIUM-DEXTROSE 2-3 GM-% IV SOLR
2.0000 g | INTRAVENOUS | Status: AC
  Administered 2013-08-20: 2 g via INTRAVENOUS

## 2013-08-20 MED ORDER — LIDOCAINE HCL (CARDIAC) 20 MG/ML IV SOLN
INTRAVENOUS | Status: DC | PRN
  Administered 2013-08-20: 50 mg via INTRAVENOUS

## 2013-08-20 MED ORDER — MIDAZOLAM HCL 2 MG/2ML IJ SOLN: 1.0000 mg | INTRAMUSCULAR | Status: DC | PRN

## 2013-08-20 MED ORDER — CEFAZOLIN SODIUM-DEXTROSE 2-3 GM-% IV SOLR: 2.0000 g | INTRAVENOUS | Status: DC

## 2013-08-20 MED ORDER — CEFAZOLIN SODIUM-DEXTROSE 2-3 GM-% IV SOLR
INTRAVENOUS | Status: AC
  Filled 2013-08-20: qty 50

## 2013-08-20 MED ORDER — PROPOFOL 10 MG/ML IV BOLUS
INTRAVENOUS | Status: AC
  Filled 2013-08-20: qty 40

## 2013-08-20 MED ORDER — ONDANSETRON HCL 4 MG/2ML IJ SOLN: 4.0000 mg | Freq: Once | INTRAMUSCULAR | Status: DC | PRN

## 2013-08-20 MED ORDER — BUPIVACAINE HCL (PF) 0.25 % IJ SOLN
INTRAMUSCULAR | Status: DC | PRN
  Administered 2013-08-20: 9 mL

## 2013-08-20 MED ORDER — TRAMADOL HCL 50 MG PO TABS: 50.0000 mg | ORAL_TABLET | Freq: Four times a day (QID) | ORAL | Status: AC | PRN

## 2013-08-20 MED ORDER — DEXAMETHASONE SODIUM PHOSPHATE 10 MG/ML IJ SOLN
INTRAMUSCULAR | Status: DC | PRN
  Administered 2013-08-20: 10 mg via INTRAVENOUS

## 2013-08-20 MED ORDER — FENTANYL CITRATE 0.05 MG/ML IJ SOLN
INTRAMUSCULAR | Status: AC
  Filled 2013-08-20: qty 4

## 2013-08-20 MED ORDER — HYDROMORPHONE HCL PF 1 MG/ML IJ SOLN: 0.2500 mg | INTRAMUSCULAR | Status: DC | PRN

## 2013-08-20 SURGICAL SUPPLY — 34 items
BLADE SURG 15 STRL LF DISP TIS (BLADE) ×3 IMPLANT
BLADE SURG 15 STRL SS (BLADE) ×3
BNDG CMPR 9X4 STRL LF SNTH (GAUZE/BANDAGES/DRESSINGS) ×1
BNDG COHESIVE 3X5 TAN STRL LF (GAUZE/BANDAGES/DRESSINGS) ×3 IMPLANT
BNDG ESMARK 4X9 LF (GAUZE/BANDAGES/DRESSINGS) ×2 IMPLANT
BNDG GAUZE ELAST 4 BULKY (GAUZE/BANDAGES/DRESSINGS) ×3 IMPLANT
CHLORAPREP W/TINT 26ML (MISCELLANEOUS) ×3 IMPLANT
CORDS BIPOLAR (ELECTRODE) ×3 IMPLANT
COVER MAYO STAND STRL (DRAPES) ×3 IMPLANT
COVER TABLE BACK 60X90 (DRAPES) ×3 IMPLANT
CUFF TOURNIQUET SINGLE 18IN (TOURNIQUET CUFF) ×3 IMPLANT
DRAPE EXTREMITY T 121X128X90 (DRAPE) ×3 IMPLANT
DRAPE SURG 17X23 STRL (DRAPES) ×3 IMPLANT
DRSG PAD ABDOMINAL 8X10 ST (GAUZE/BANDAGES/DRESSINGS) ×3 IMPLANT
GAUZE SPONGE 4X4 12PLY STRL (GAUZE/BANDAGES/DRESSINGS) ×3 IMPLANT
GAUZE XEROFORM 1X8 LF (GAUZE/BANDAGES/DRESSINGS) ×3 IMPLANT
GLOVE BIOGEL PI IND STRL 8.5 (GLOVE) ×1 IMPLANT
GLOVE BIOGEL PI INDICATOR 8.5 (GLOVE) ×2
GLOVE EXAM NITRILE MD LF STRL (GLOVE) ×2 IMPLANT
GLOVE SURG ORTHO 8.0 STRL STRW (GLOVE) ×3 IMPLANT
GLOVE SURG SS PI 7.0 STRL IVOR (GLOVE) ×2 IMPLANT
GOWN STRL REUS W/ TWL LRG LVL3 (GOWN DISPOSABLE) ×1 IMPLANT
GOWN STRL REUS W/TWL LRG LVL3 (GOWN DISPOSABLE) ×6
GOWN STRL REUS W/TWL XL LVL3 (GOWN DISPOSABLE) ×3 IMPLANT
NEEDLE 27GAX1X1/2 (NEEDLE) ×2 IMPLANT
NS IRRIG 1000ML POUR BTL (IV SOLUTION) ×3 IMPLANT
PACK BASIN DAY SURGERY FS (CUSTOM PROCEDURE TRAY) ×3 IMPLANT
STOCKINETTE 4X48 STRL (DRAPES) ×3 IMPLANT
SUT VICRYL 4-0 PS2 18IN ABS (SUTURE) IMPLANT
SUT VICRYL RAPIDE 4/0 PS 2 (SUTURE) ×3 IMPLANT
SYR BULB 3OZ (MISCELLANEOUS) ×3 IMPLANT
SYR CONTROL 10ML LL (SYRINGE) IMPLANT
TOWEL OR 17X24 6PK STRL BLUE (TOWEL DISPOSABLE) ×3 IMPLANT
UNDERPAD 30X30 INCONTINENT (UNDERPADS AND DIAPERS) ×3 IMPLANT

## 2013-08-20 NOTE — Discharge Instructions (Addendum)

## 2013-08-20 NOTE — Anesthesia Procedure Notes (Signed)
Procedure Name: LMA Insertion Date/Time: 08/20/2013 1:03 PM Performed by: Lyndee Leo Pre-anesthesia Checklist: Patient identified, Emergency Drugs available, Suction available and Patient being monitored Patient Re-evaluated:Patient Re-evaluated prior to inductionOxygen Delivery Method: Circle System Utilized Preoxygenation: Pre-oxygenation with 100% oxygen Intubation Type: IV induction Ventilation: Mask ventilation without difficulty LMA: LMA inserted LMA Size: 3.0 Number of attempts: 1 Airway Equipment and Method: bite block Placement Confirmation: positive ETCO2 Tube secured with: Tape Dental Injury: Teeth and Oropharynx as per pre-operative assessment

## 2013-08-20 NOTE — Op Note (Signed)
Dictation Number [id number]

## 2013-08-20 NOTE — Transfer of Care (Signed)
Immediate Anesthesia Transfer of Care Note  Patient: Carmen Hammond  Procedure(s) Performed: Procedure(s): LEFT CARPAL TUNNEL RELEASE (Left)  Patient Location: PACU  Anesthesia Type:General  Level of Consciousness: sedated and patient cooperative  Airway & Oxygen Therapy: Patient Spontanous Breathing and Patient connected to face mask oxygen  Post-op Assessment: Report given to PACU RN and Post -op Vital signs reviewed and stable  Post vital signs: Reviewed and stable  Complications: No apparent anesthesia complications

## 2013-08-20 NOTE — Brief Op Note (Signed)
08/20/2013  1:31 PM  PATIENT:  Carmen Hammond  80 y.o. female  PRE-OPERATIVE DIAGNOSIS:  carpal tunnel syndrome left  POST-OPERATIVE DIAGNOSIS:  carpal tunnel syndrome left  PROCEDURE:  Procedure(s): LEFT CARPAL TUNNEL RELEASE (Left)  SURGEON:  Surgeon(s) and Role:    * [name], MD - Primary  PHYSICIAN ASSISTANT:   ASSISTANTS: none   ANESTHESIA:   local and general  EBL:     BLOOD ADMINISTERED:none  DRAINS: none   LOCAL MEDICATIONS USED:  BUPIVICAINE   SPECIMEN:  No Specimen  DISPOSITION OF SPECIMEN:  N/A  COUNTS:  YES  TOURNIQUET:   Total Tourniquet Time Documented: Upper Arm (Left) - 12 minutes Total: Upper Arm (Left) - 12 minutes   DICTATION: .Other Dictation: Dictation Number [id number]  PLAN OF CARE: Discharge to home after PACU  PATIENT DISPOSITION:  PACU - hemodynamically stable.

## 2013-08-20 NOTE — Anesthesia Preprocedure Evaluation (Signed)
Anesthesia Evaluation  Patient identified by MRN, date of birth, ID band Patient awake    Reviewed: Allergy & Precautions, H&P , NPO status , Patient's Chart, lab work & pertinent test results  Airway Mallampati: II TM Distance: >3 FB Neck ROM: Full    Dental  (+) Teeth Intact, Dental Advisory Given   Pulmonary  breath sounds clear to auscultation        Cardiovascular hypertension, Rhythm:Regular Rate:Normal     Neuro/Psych    GI/Hepatic   Endo/Other    Renal/GU      Musculoskeletal   Abdominal   Peds  Hematology   Anesthesia Other Findings   Reproductive/Obstetrics                           Anesthesia Physical Anesthesia Plan  ASA: II  Anesthesia Plan: General   Post-op Pain Management:    Induction: Intravenous  Airway Management Planned: LMA  Additional Equipment:   Intra-op Plan:   Post-operative Plan:   Informed Consent: I have reviewed the patients History and Physical, chart, labs and discussed the procedure including the risks, benefits and alternatives for the proposed anesthesia with the patient or authorized representative who has indicated his/her understanding and acceptance.   Dental advisory given  Plan Discussed with: CRNA and Anesthesiologist  Anesthesia Plan Comments: (L. CTS H/O SVT S/P ablation Htn  Plan GA with LMA  Roberts Gaudy)        Anesthesia Quick Evaluation

## 2013-08-20 NOTE — Anesthesia Postprocedure Evaluation (Signed)
  Anesthesia Post-op Note  Patient: Carmen Hammond  Procedure(s) Performed: Procedure(s): LEFT CARPAL TUNNEL RELEASE (Left)  Patient Location: PACU  Anesthesia Type:General  Level of Consciousness: awake, alert  and oriented  Airway and Oxygen Therapy: Patient Spontanous Breathing and Patient connected to nasal cannula oxygen  Post-op Pain: mild  Post-op Assessment: Post-op Vital signs reviewed, Patient's Cardiovascular Status Stable, Respiratory Function Stable, Patent Airway and Pain level controlled  Post-op Vital Signs: stable  Last Vitals:  Filed Vitals:   08/20/13 1430  BP: 157/81  Pulse: 98  Temp: 36.4 C  Resp: 16    Complications: No apparent anesthesia complications

## 2013-08-20 NOTE — H&P (Signed)
Carmen Hammond  85 and right hand dominant. She is complaining of bilateral hand pain, swelling, numbness and tingling. She has shooting pains going up her arms, left greater than right. This has been going on for several months. It has gotten worse over the past week. She complains of intermittent severe stabbing pain over the basilar area of her thumbs especially on her left side. She has a history of spinal stenosis treated by Dr. Patrice Paradise. She states shaking her hand and changing position frequently helps. She is awakened occasionally at night. She has no history of injury to the hand or neck other than her spinal stenosis. She has a history of arthritis. No history of diabetes, thyroid problems, or gout. There is a family history of diabetes and she has been tested. She has had her nerve conductions done revealing carpal tunnel syndrome on both sides, left much worse than right with a motor delay of 7.0 on the left, 5.5 on the right, sensory delay of 5.1 on the left, 5.2 on the right with amplitude diminution to 17 on the left and 15 on the right.  PAST MEDICAL HISTORY: She is allergic to Darvocet. She is on Metoprolol, Levothyroxine, and Tramadol. She has had a right hip replacement, left hip replacement, cataract surgery, back surgery, an ablation in her heart.  FAMILY H ISTORY: Positive for diabetes, high BP and arthritis.  SOCIAL HISTORY: She does not smoke or drink. She is retired and divorced.  REVIEW OF SYSTEMS: Positive for cataracts, otherwise negative for 14 points. Carmen Hammond is an 80 y.o. female.   Chief Complaint: CTR left HPI: see above  Past Medical History  Diagnosis Date  . Wears glasses   . Dental crowns present   . Dysrhythmia 2012    cardiac ablation  . Hypertension   . Hypothyroidism   . Arthritis     Past Surgical History  Procedure Laterality Date  . Cesarean section  [phone]    x4  . Ablation of dysrhythmic focus  2012    [place] surgery       both cataracts  . Total hip arthroplasty  2008    left  . Total hip arthroplasty  2007    right  . Lumbar fusion  2009    History reviewed. No pertinent family history. Social History:  reports that she has never smoked. She does not have any smokeless tobacco history on file. She reports that she does not drink alcohol or use illicit drugs.  Allergies: Not on File  Medications Prior to Admission  Medication Sig Dispense Refill  . aspirin 81 MG tablet Take 81 mg by mouth daily.      Carmen Hammond levothyroxine (SYNTHROID, LEVOTHROID) 50 MCG tablet Take 50 mcg by mouth daily before breakfast.      . metoprolol succinate (TOPROL-XL) 50 MG 24 hr tablet Take 50 mg by mouth 2 (two) times daily. Take with or immediately following a meal.      . traMADol (ULTRAM) 50 MG tablet Take by mouth every 6 (six) hours as needed.        Results for orders placed during the hospital encounter of 08/20/13 (from the past 48 hour(s))  POCT I-STAT, CHEM 8     Status: Abnormal   Collection Time    08/20/13 11:46 AM      Result Value Ref Range   Sodium 141  137 - 147 mEq/L   Potassium 4.0  3.7 - 5.3 mEq/L  Chloride 105  96 - 112 mEq/L   BUN 17  6 - 23 mg/dL   Creatinine, Ser 0.90  0.50 - 1.10 mg/dL   Glucose, Bld 92  70 - 99 mg/dL   Calcium, Ion 1.09 (*) 1.13 - 1.30 mmol/L   TCO2 27  0 - 100 mmol/L   Hemoglobin 14.3  12.0 - 15.0 g/dL   HCT 42.0  36.0 - 46.0 %    No results found.   Pertinent items are noted in HPI.  Blood pressure 184/90, pulse 52, temperature 98.2 F (36.8 C), temperature source Oral, resp. rate 18, height 5' 0.5" (1.537 m), weight 74.39 kg (164 lb), SpO2 98.00%.  General appearance: alert, cooperative and appears stated age Head: Normocephalic, without obvious abnormality Neck: no JVD Resp: clear to auscultation bilaterally Cardio: regular rate and rhythm, S1, S2 normal, no murmur, click, rub or gallop GI: soft, non-tender; bowel sounds normal; no masses,  no  organomegaly Extremities: extremities normal, atraumatic, no cyanosis or edema Pulses: 2+ and symmetric Skin: Skin color, texture, turgor normal. 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  X-rays of her cervical spine reveals significant degenerative changes at most levels with significant foraminal encroachment. We have discussed that she likely ha a double crush and release of either carpal tunnel may not give her complete relief of symptoms but that this would be preferable to any surgical intervention on her neck. The pre, peri and post op course are discussed along with risks and complications.  She is aware there is no guarantee with surgery, possibility of infection, recurrence, injury to arteries, nerves and tendons, incomplete relief of symptoms and dystrophy.  Plan carpal tunnel release left.  Carmen Hammond 08/20/2013, 12:23 PM

## 2013-08-21 NOTE — Op Note (Signed)
NAMESHENISE, WOLGAMOTT NO.:  [id number]  MEDICAL RECORD NO.:  [id number]  LOCATION:                                 FACILITY:  PHYSICIAN:  Daryll Brod, M.D.            DATE OF BIRTH:  DATE OF PROCEDURE:  08/20/2013 DATE OF DISCHARGE:                              OPERATIVE REPORT   PREOPERATIVE DIAGNOSIS:  Carpal tunnel syndrome, left hand.  POSTOPERATIVE DIAGNOSIS:  Carpal tunnel syndrome, left hand.  OPERATION:  Decompression of left median nerve.  SURGEON:  Daryll Brod, M.D.  ANESTHESIA:  General with local infiltration.  ANESTHESIOLOGIST:  Glynda Jaeger, M.D.  HISTORY:  The patient is an 80 year old female with a history of carpal tunnel syndrome.  She is admitted for release of left side.  Pre, peri, and postoperative courses have been discussed along with risks and complications.  She is aware that there is no guarantee with the surgery; possibility of infection; recurrence of injury to arteries, nerves, tendons; incomplete relief of symptoms; and dystrophy.  In the preoperative area, the patient was seen, the extremity was marked by both patient and surgeon, and antibiotic given.  PROCEDURE IN DETAIL:  The patient was brought to the operating room, where general anesthetic was carried out without difficulty under the direction of Dr. Linna Caprice.  She was prepped using ChloraPrep, supine position, left arm free.  A 3-minute dry-time was allowed.  Time-out taken, confirming the patient and procedure.  A longitudinal incision was made in the left palm after exsanguination of the limb with an Esmarch bandage.  On deflation of the tourniquet, on her arm to 250 mmHg.  The palmar fascia was split.  Bleeders were electrocauterized with bipolar.  The flexor retinaculum was identified.  The flexor tendon of the ring and little finger identified distally.  A longitudinal incision was made to the flexor carpi radialis after placement of retractors to protect the  nerve to the radial side, the ulnar nerve to the ulnar side.  The flexor retinaculum was released.  The fascia was then released proximal to the wrist crease after placement of a Sewall retractor to allow visualization of the distal break of forearm fascia. This was done for approximately 2-3 cm proximally, taking care to protect the underlying median nerve.  The canal was explored.  Area of compression to the nerve was apparent with an hourglass deformity. Motor branch entered in the muscle.  Tenosynovial tissue was moderately thickened.  No further lesions were identified.  The wound was irrigated.  The skin was closed with interrupted 4-0 Vicryl Rapide sutures.  A local infiltration with 0.25% Marcaine without epinephrine was given, 9 mL was used.  Sterile compressive dressing with the fingers free was applied.  On deflation of the tourniquet, all fingers were immediately pinked.  She was taken to the recovery room for observation in satisfactory condition.  She will be discharged to home to return to the [facility] in 1 week, on [facility].          ______________________________ Daryll Brod, M.D.     GK/MEDQ  D:  08/20/2013  T:  08/21/2013  Job:  [id number] 

## 2013-08-24 ENCOUNTER — Encounter (HOSPITAL_BASED_OUTPATIENT_CLINIC_OR_DEPARTMENT_OTHER): Payer: Self-pay | Admitting: Orthopedic Surgery

## 2013-08-24 ENCOUNTER — Ambulatory Visit
Admission: RE | Admit: 2013-08-24 | Discharge: 2013-08-24 | Disposition: A | Discharge status: Home / Self Care | Payer: Medicare Other | Source: Ambulatory Visit

## 2013-08-24 DIAGNOSIS — Z1231 Encounter for screening mammogram for malignant neoplasm of breast: Secondary | ICD-10-CM

## 2014-08-08 DIAGNOSIS — I498 Other specified cardiac arrhythmias: Secondary | ICD-10-CM | POA: Diagnosis not present

## 2014-08-08 DIAGNOSIS — E780 Pure hypercholesterolemia: Secondary | ICD-10-CM | POA: Diagnosis not present

## 2014-08-08 DIAGNOSIS — E559 Vitamin D deficiency, unspecified: Secondary | ICD-10-CM | POA: Diagnosis not present

## 2014-08-11 DIAGNOSIS — Z0001 Encounter for general adult medical examination with abnormal findings: Secondary | ICD-10-CM | POA: Diagnosis not present

## 2014-08-11 DIAGNOSIS — E039 Hypothyroidism, unspecified: Secondary | ICD-10-CM | POA: Diagnosis not present

## 2014-08-11 DIAGNOSIS — I10 Essential (primary) hypertension: Secondary | ICD-10-CM | POA: Diagnosis not present

## 2014-08-11 DIAGNOSIS — Z23 Encounter for immunization: Secondary | ICD-10-CM | POA: Diagnosis not present

## 2014-12-20 DIAGNOSIS — E039 Hypothyroidism, unspecified: Secondary | ICD-10-CM | POA: Diagnosis not present

## 2014-12-20 DIAGNOSIS — I471 Supraventricular tachycardia: Secondary | ICD-10-CM | POA: Diagnosis not present

## 2014-12-20 DIAGNOSIS — N183 Chronic kidney disease, stage 3 (moderate): Secondary | ICD-10-CM | POA: Diagnosis not present

## 2014-12-20 DIAGNOSIS — E785 Hyperlipidemia, unspecified: Secondary | ICD-10-CM | POA: Diagnosis not present

## 2014-12-20 DIAGNOSIS — I129 Hypertensive chronic kidney disease with stage 1 through stage 4 chronic kidney disease, or unspecified chronic kidney disease: Secondary | ICD-10-CM | POA: Diagnosis not present

## 2015-03-29 DIAGNOSIS — E039 Hypothyroidism, unspecified: Secondary | ICD-10-CM | POA: Diagnosis not present

## 2015-03-29 DIAGNOSIS — I471 Supraventricular tachycardia: Secondary | ICD-10-CM | POA: Diagnosis not present

## 2015-03-29 DIAGNOSIS — L821 Other seborrheic keratosis: Secondary | ICD-10-CM | POA: Diagnosis not present

## 2015-04-19 DIAGNOSIS — R000 Tachycardia, unspecified: Secondary | ICD-10-CM | POA: Diagnosis not present

## 2015-04-19 DIAGNOSIS — Z8679 Personal history of other diseases of the circulatory system: Secondary | ICD-10-CM | POA: Diagnosis not present

## 2015-05-11 DIAGNOSIS — M150 Primary generalized (osteo)arthritis: Secondary | ICD-10-CM | POA: Diagnosis not present

## 2015-05-11 DIAGNOSIS — E039 Hypothyroidism, unspecified: Secondary | ICD-10-CM | POA: Diagnosis not present

## 2015-05-11 DIAGNOSIS — D229 Melanocytic nevi, unspecified: Secondary | ICD-10-CM | POA: Diagnosis not present

## 2016-12-06 ENCOUNTER — Other Ambulatory Visit: Payer: Self-pay | Admitting: Orthopaedic Surgery

## 2016-12-06 DIAGNOSIS — M4726 Other spondylosis with radiculopathy, lumbar region: Secondary | ICD-10-CM

## 2016-12-13 ENCOUNTER — Other Ambulatory Visit: Payer: Self-pay

## 2016-12-20 ENCOUNTER — Ambulatory Visit
Admission: RE | Admit: 2016-12-20 | Discharge: 2016-12-20 | Disposition: A | Discharge status: Home / Self Care | Payer: Medicare Other | Source: Ambulatory Visit | Attending: Orthopaedic Surgery | Admitting: Orthopaedic Surgery

## 2016-12-20 DIAGNOSIS — M4726 Other spondylosis with radiculopathy, lumbar region: Secondary | ICD-10-CM

## 2017-07-19 HISTORY — PX: BRAIN SURGERY: SHX531

## 2019-05-30 ENCOUNTER — Ambulatory Visit: Payer: Medicare Other | Attending: Internal Medicine

## 2019-05-30 DIAGNOSIS — Z23 Encounter for immunization: Secondary | ICD-10-CM | POA: Insufficient documentation

## 2019-05-30 NOTE — Progress Notes (Signed)
   Covid-19 Vaccination Clinic  Name:  Carmen Hammond    MRN: [id number] DOB: 09/20/1928  05/30/2019  [name] was observed post Covid-19 immunization for 15 minutes without incidence. She was provided with Vaccine Information Sheet and instruction to access the V-Safe system.   [name] was instructed to call 911 with any severe reactions post vaccine: Marland Kitchen Difficulty breathing  . Swelling of your face and throat  . A fast heartbeat  . A bad rash all over your body  . Dizziness and weakness    Immunizations Administered    Name Date Dose VIS Date Route   Pfizer COVID-19 Vaccine 05/30/2019  2:49 PM 0.3 mL 03/12/2019 Intramuscular   Manufacturer: [place]   Lot: [id number]   [place]: [id number]

## 2019-06-29 ENCOUNTER — Ambulatory Visit: Payer: Medicare Other | Attending: Internal Medicine

## 2019-06-29 DIAGNOSIS — Z23 Encounter for immunization: Secondary | ICD-10-CM

## 2019-06-29 NOTE — Progress Notes (Signed)
   Covid-19 Vaccination Clinic  Name:  Carmen Hammond    MRN: [id number] DOB: Sep 05, 1928  06/29/2019  Carmen Hammond was observed post Covid-19 immunization for 15 minutes without incident. She was provided with Vaccine Information Sheet and instruction to access the V-Safe system.   Carmen Hammond was instructed to call 911 with any severe reactions post vaccine: Marland Kitchen Difficulty breathing  . Swelling of face and throat  . A fast heartbeat  . A bad rash all over body  . Dizziness and weakness   Immunizations Administered    Name Date Dose VIS Date Route   Pfizer COVID-19 Vaccine 06/29/2019  2:47 PM 0.3 mL 03/12/2019 Intramuscular   Manufacturer: [place]   Lot: [id number]   [place]: [id number]

## 2021-05-24 ENCOUNTER — Other Ambulatory Visit: Payer: Self-pay

## 2021-05-24 ENCOUNTER — Encounter (HOSPITAL_COMMUNITY): Payer: Self-pay

## 2021-05-24 ENCOUNTER — Inpatient Hospital Stay (HOSPITAL_COMMUNITY)
Admission: EM | Admit: 2021-05-24 | Discharge: 2021-05-28 | DRG: 064 | Disposition: A | Discharge status: Home Health | Payer: Medicare Other | Attending: Internal Medicine | Admitting: Internal Medicine

## 2021-05-24 ENCOUNTER — Inpatient Hospital Stay (HOSPITAL_COMMUNITY): Payer: Medicare Other

## 2021-05-24 ENCOUNTER — Emergency Department (HOSPITAL_COMMUNITY): Payer: Medicare Other

## 2021-05-24 DIAGNOSIS — E039 Hypothyroidism, unspecified: Secondary | ICD-10-CM | POA: Diagnosis present

## 2021-05-24 DIAGNOSIS — E785 Hyperlipidemia, unspecified: Secondary | ICD-10-CM | POA: Diagnosis present

## 2021-05-24 DIAGNOSIS — R29700 NIHSS score 0: Secondary | ICD-10-CM | POA: Diagnosis present

## 2021-05-24 DIAGNOSIS — E669 Obesity, unspecified: Secondary | ICD-10-CM | POA: Diagnosis present

## 2021-05-24 DIAGNOSIS — I63511 Cerebral infarction due to unspecified occlusion or stenosis of right middle cerebral artery: Principal | ICD-10-CM | POA: Diagnosis present

## 2021-05-24 DIAGNOSIS — H93A9 Pulsatile tinnitus, unspecified ear: Secondary | ICD-10-CM | POA: Diagnosis present

## 2021-05-24 DIAGNOSIS — G9341 Metabolic encephalopathy: Secondary | ICD-10-CM | POA: Diagnosis present

## 2021-05-24 DIAGNOSIS — I639 Cerebral infarction, unspecified: Secondary | ICD-10-CM | POA: Diagnosis not present

## 2021-05-24 DIAGNOSIS — R29810 Facial weakness: Secondary | ICD-10-CM | POA: Diagnosis present

## 2021-05-24 DIAGNOSIS — Z79899 Other long term (current) drug therapy: Secondary | ICD-10-CM | POA: Diagnosis not present

## 2021-05-24 DIAGNOSIS — I10 Essential (primary) hypertension: Secondary | ICD-10-CM | POA: Diagnosis present

## 2021-05-24 DIAGNOSIS — Z6831 Body mass index (BMI) 31.0-31.9, adult: Secondary | ICD-10-CM

## 2021-05-24 DIAGNOSIS — I110 Hypertensive heart disease with heart failure: Secondary | ICD-10-CM | POA: Diagnosis present

## 2021-05-24 DIAGNOSIS — R270 Ataxia, unspecified: Secondary | ICD-10-CM | POA: Diagnosis present

## 2021-05-24 DIAGNOSIS — I69354 Hemiplegia and hemiparesis following cerebral infarction affecting left non-dominant side: Secondary | ICD-10-CM | POA: Diagnosis not present

## 2021-05-24 DIAGNOSIS — Z79890 Hormone replacement therapy (postmenopausal): Secondary | ICD-10-CM

## 2021-05-24 DIAGNOSIS — I5032 Chronic diastolic (congestive) heart failure: Secondary | ICD-10-CM | POA: Diagnosis present

## 2021-05-24 DIAGNOSIS — Z7982 Long term (current) use of aspirin: Secondary | ICD-10-CM | POA: Diagnosis not present

## 2021-05-24 DIAGNOSIS — R4781 Slurred speech: Secondary | ICD-10-CM | POA: Diagnosis present

## 2021-05-24 DIAGNOSIS — Z20822 Contact with and (suspected) exposure to covid-19: Secondary | ICD-10-CM | POA: Diagnosis present

## 2021-05-24 DIAGNOSIS — E7800 Pure hypercholesterolemia, unspecified: Secondary | ICD-10-CM | POA: Diagnosis not present

## 2021-05-24 DIAGNOSIS — Z981 Arthrodesis status: Secondary | ICD-10-CM

## 2021-05-24 DIAGNOSIS — Z96643 Presence of artificial hip joint, bilateral: Secondary | ICD-10-CM | POA: Diagnosis present

## 2021-05-24 DIAGNOSIS — I6389 Other cerebral infarction: Secondary | ICD-10-CM | POA: Diagnosis not present

## 2021-05-24 LAB — URINALYSIS, ROUTINE W REFLEX MICROSCOPIC
Bilirubin Urine: NEGATIVE
Glucose, UA: NEGATIVE mg/dL
Hgb urine dipstick: NEGATIVE
Ketones, ur: NEGATIVE mg/dL
Nitrite: NEGATIVE
Protein, ur: NEGATIVE mg/dL
Specific Gravity, Urine: 1.015 (ref 1.005–1.030)
pH: 8 (ref 5.0–8.0)

## 2021-05-24 LAB — COMPREHENSIVE METABOLIC PANEL
ALT: 20 U/L (ref 0–44)
AST: 25 U/L (ref 15–41)
Albumin: 3.9 g/dL (ref 3.5–5.0)
Alkaline Phosphatase: 47 U/L (ref 38–126)
Anion gap: 6 (ref 5–15)
BUN: 15 mg/dL (ref 8–23)
CO2: 27 mmol/L (ref 22–32)
Calcium: 9.1 mg/dL (ref 8.9–10.3)
Chloride: 103 mmol/L (ref 98–111)
Creatinine, Ser: 0.82 mg/dL (ref 0.44–1.00)
GFR, Estimated: >60 mL/min (ref >60)
Glucose, Bld: 112 mg/dL — ABNORMAL HIGH (ref 70–99)
Potassium: 3.7 mmol/L (ref 3.5–5.1)
Sodium: 136 mmol/L (ref 135–145)
Total Bilirubin: 0.6 mg/dL (ref 0.3–1.2)
Total Protein: 7.3 g/dL (ref 6.5–8.1)

## 2021-05-24 LAB — CBC WITH DIFFERENTIAL/PLATELET
Abs Immature Granulocytes: 0.05 10*3/uL (ref 0.00–0.07)
Basophils Absolute: 0 10*3/uL (ref 0.0–0.1)
Basophils Relative: 0 %
Eosinophils Absolute: 0 10*3/uL (ref 0.0–0.5)
Eosinophils Relative: 0 %
HCT: 37.6 % (ref 36.0–46.0)
Hemoglobin: 12.4 g/dL (ref 12.0–15.0)
Immature Granulocytes: 1 %
Lymphocytes Relative: 13 %
Lymphs Abs: 1.2 10*3/uL (ref 0.7–4.0)
MCH: 30.8 pg (ref 26.0–34.0)
MCHC: 33 g/dL (ref 30.0–36.0)
MCV: 93.3 fL (ref 80.0–100.0)
Monocytes Absolute: 0.5 10*3/uL (ref 0.1–1.0)
Monocytes Relative: 6 %
Neutro Abs: 7.4 10*3/uL (ref 1.7–7.7)
Neutrophils Relative %: 80 %
Platelets: 197 10*3/uL (ref 150–400)
RBC: 4.03 MIL/uL (ref 3.87–5.11)
RDW: 13.9 % (ref 11.5–15.5)
WBC: 9.2 10*3/uL (ref 4.0–10.5)
nRBC: 0 % (ref 0.0–0.2)

## 2021-05-24 LAB — AMMONIA: Ammonia: <10 umol/L (ref 9–35)

## 2021-05-24 LAB — RESP PANEL BY RT-PCR (FLU A&B, COVID) ARPGX2
Influenza A by PCR: NEGATIVE
Influenza B by PCR: NEGATIVE
SARS Coronavirus 2 by RT PCR: NEGATIVE

## 2021-05-24 LAB — URINALYSIS, MICROSCOPIC (REFLEX)

## 2021-05-24 MED ORDER — IOHEXOL 350 MG/ML SOLN
60.0000 mL | Freq: Once | INTRAVENOUS | Status: AC | PRN
  Administered 2021-05-24: 60 mL via INTRAVENOUS

## 2021-05-24 MED ORDER — CLOPIDOGREL BISULFATE 75 MG PO TABS
75.0000 mg | ORAL_TABLET | Freq: Every day | ORAL | Status: DC
  Administered 2021-05-25 – 2021-05-28 (×4): 75 mg via ORAL
  Filled 2021-05-24 (×4): qty 1

## 2021-05-24 MED ORDER — ENOXAPARIN SODIUM 40 MG/0.4ML IJ SOSY: 40.0000 mg | PREFILLED_SYRINGE | INTRAMUSCULAR | Status: DC

## 2021-05-24 MED ORDER — ACETAMINOPHEN 160 MG/5ML PO SOLN: 650.0000 mg | ORAL | Status: DC | PRN

## 2021-05-24 MED ORDER — ACETAMINOPHEN 325 MG PO TABS
650.0000 mg | ORAL_TABLET | ORAL | Status: DC | PRN
  Administered 2021-05-25: 650 mg via ORAL
  Filled 2021-05-24: qty 2

## 2021-05-24 MED ORDER — ASPIRIN 325 MG PO TABS
325.0000 mg | ORAL_TABLET | Freq: Every day | ORAL | Status: DC
  Administered 2021-05-24 – 2021-05-28 (×5): 325 mg via ORAL
  Filled 2021-05-24 (×5): qty 1

## 2021-05-24 MED ORDER — ASPIRIN 300 MG RE SUPP: 300.0000 mg | Freq: Every day | RECTAL | Status: DC

## 2021-05-24 MED ORDER — STROKE: EARLY STAGES OF RECOVERY BOOK
Freq: Once | Status: AC
  Filled 2021-05-24: qty 1

## 2021-05-24 MED ORDER — ACETAMINOPHEN 650 MG RE SUPP: 650.0000 mg | RECTAL | Status: DC | PRN

## 2021-05-24 MED ORDER — SENNOSIDES-DOCUSATE SODIUM 8.6-50 MG PO TABS: 1.0000 | ORAL_TABLET | Freq: Every evening | ORAL | Status: DC | PRN

## 2021-05-24 MED ORDER — ATORVASTATIN CALCIUM 80 MG PO TABS
80.0000 mg | ORAL_TABLET | Freq: Every day | ORAL | Status: DC
  Administered 2021-05-24: 80 mg via ORAL
  Filled 2021-05-24: qty 1

## 2021-05-24 MED ORDER — LEVOTHYROXINE SODIUM 50 MCG PO TABS
50.0000 ug | ORAL_TABLET | Freq: Every day | ORAL | Status: DC
  Administered 2021-05-25 – 2021-05-28 (×4): 50 ug via ORAL
  Filled 2021-05-24 (×4): qty 1

## 2021-05-24 NOTE — Assessment & Plan Note (Addendum)
Continue synthroid.

## 2021-05-24 NOTE — ED Notes (Signed)
Returned from CT/ MRI. A&O x 4. Remains drowsy, but easily aroused and able to answer questions clearly and follow commands.

## 2021-05-24 NOTE — ED Notes (Signed)
Pt said she was wet but her blankets were a little wet so I changed her blankets but she didn't want her chuck changed which she said was not wet.

## 2021-05-24 NOTE — Assessment & Plan Note (Signed)
-  Monitor blood pressure.  Allow for permissive hypertension

## 2021-05-24 NOTE — ED Notes (Signed)
Resting with eyes closed. No signs of distress. VSS. Daughter at bedside. Awaiting transport to [facility] admission room.

## 2021-05-24 NOTE — Consult Note (Signed)
Neurology Consultation  Reason for Consult: Stroke, confusion Referring Physician: Dr. Starla Link, hospitalist  CC: Confusion  History is obtained from: Chart, patient, family  HPI: Carmen [place] is a 88 y.o. female past medical history of hypertension, hypothyroidism, arrhythmia with cardiac ablation, independently living, who was brought in for evaluation of confusion that has been going on now for the past couple of days.  Family reports that the patient is quite independent, takes her own medications, drives and loves to going to the dollar store to shop almost every day but did not wake up normally this morning.  Appeared more confused and also had some slurred speech.  She felt very fatigued and lowered herself to the ground without having any fall or head injury.  Son found her in the bathroom.  Was seen and evaluated at the [facility] emergency room.  Unremarkable initial labs.  CT head suspicious for acute/subacute infarct in the right MCA territory.  MRI was done that confirmed right MCA territory infarct. Patient was outside the window for IV or endovascular interventions.  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 Harvel Ricks at [facility].  The dural AV fistula was found on evaluation of pulsatile tinnitus in 2021.  Her tinnitus resolved after the procedure.  LKW: 2 days ago tpa given?: no, outside the window Premorbid modified Rankin scale (mRS): 0  ROS: Full ROS was performed and is negative except as noted in the HPI.   Past Medical History:  Diagnosis Date   Arthritis    Dental crowns present    Dysrhythmia 2012   cardiac ablation   Hypertension    Hypothyroidism    Wears glasses     History reviewed. No pertinent family history.   Social History:   reports that she has never smoked. She does not have any smokeless tobacco history on file. She reports that she does not drink  alcohol and does not use drugs.  At baseline, independent, lives at home.  Retired Nurse, children's.  Sings in 5 languages.  Enjoys watching travel in our channels.  Mentally very sharp according to family  Medications  Current Facility-Administered Medications:    acetaminophen (TYLENOL) tablet 650 mg, 650 mg, Oral, Q4H PRN **OR** acetaminophen (TYLENOL) 160 MG/5ML solution 650 mg, 650 mg, Per Tube, Q4H PRN **OR** acetaminophen (TYLENOL) suppository 650 mg, 650 mg, Rectal, Q4H PRN, Starla Link, Kshitiz, MD   aspirin suppository 300 mg, 300 mg, Rectal, Daily **OR** aspirin tablet 325 mg, 325 mg, Oral, Daily, Alekh, Kshitiz, MD, 325 mg at 05/24/21 1730   atorvastatin (LIPITOR) tablet 80 mg, 80 mg, Oral, QHS, Alekh, Kshitiz, MD   Derrill Memo ON 05/25/2021] levothyroxine (SYNTHROID) tablet 50 mcg, 50 mcg, Oral, QAC breakfast, Alekh, Kshitiz, MD   senna-docusate (Senokot-S) tablet 1 tablet, 1 tablet, Oral, QHS PRN, Starla Link, Kshitiz, MD   Exam: Current vital signs: BP 139/74 (BP Location: Left Arm)    Pulse 91    Temp 97.8 F (36.6 C) (Oral)    Resp (!) 22    Ht 4\' 11"  (1.499 m)    Wt 71.7 kg    SpO2 98%    BMI 31.91 kg/m  Vital signs in last 24 hours: Temp:  [97.8 F (36.6 C)-98.3 F (36.8 C)] 97.8 F (36.6 C) (02/23 2000) Pulse Rate:  [76-92] 91 (02/23 2000) Resp:  [14-26] 22 (02/23 2000) BP: (133-159)/(61-74) 139/74 (02/23 2000) SpO2:  [  93 %-99 %] 98 % (02/23 2000) Weight:  [71.7 kg] 71.7 kg (02/23 1034) General: Awake alert in no distress HEENT: Normocephalic, atraumatic Lungs: Clear Cardiovascular: S1-S2 heard regular rate rhythm Abdomen nondistended nontender Extremities warm well perfused Neurologic exam Awake alert oriented x3 No dysarthria No aphasia Naming comprehension repetition and fluency are all preserved. Cranial nerve examination: Pupils equal round react light, extraocular movements intact, visual fields full, no visual field cuts noted on confrontation, face appears symmetric,  tongue and palate midline. Motor examination with no drift in any of the 4 extremities. Sensation intact to light touch without extinction Coordination with no dysmetria in finger-nose-finger testing bilaterally. NIH stroke scale-0  Labs I have reviewed labs in epic and the results pertinent to this consultation are:  CBC    Component Value Date/Time   WBC 9.2 05/24/2021 1155   RBC 4.03 05/24/2021 1155   HGB 12.4 05/24/2021 1155   HCT 37.6 05/24/2021 1155   PLT 197 05/24/2021 1155   MCV 93.3 05/24/2021 1155   MCH 30.8 05/24/2021 1155   MCHC 33.0 05/24/2021 1155   RDW 13.9 05/24/2021 1155   LYMPHSABS 1.2 05/24/2021 1155   MONOABS 0.5 05/24/2021 1155   EOSABS 0.0 05/24/2021 1155   BASOSABS 0.0 05/24/2021 1155    CMP     Component Value Date/Time   NA 136 05/24/2021 1155   K 3.7 05/24/2021 1155   CL 103 05/24/2021 1155   CO2 27 05/24/2021 1155   GLUCOSE 112 (H) 05/24/2021 1155   BUN 15 05/24/2021 1155   CREATININE 0.82 05/24/2021 1155   CALCIUM 9.1 05/24/2021 1155   PROT 7.3 05/24/2021 1155   ALBUMIN 3.9 05/24/2021 1155   AST 25 05/24/2021 1155   ALT 20 05/24/2021 1155   ALKPHOS 47 05/24/2021 1155   BILITOT 0.6 05/24/2021 1155   GFRNONAA >60 05/24/2021 1155   Imaging I have reviewed the images obtained:  CT-head-hypodensity in the right MCA territory.  No bleed  MRI examination of the brain-acute to early subacute infarct in the right insula and frontal operculum extending to the corona radiata in the MCA distribution on the right with additional punctate infarct in the right centrum semiovale.  No hemorrhagic transformation.  CT angiography head and neck-focal severe stenosis of the proximal superior M2 branch of the right MCA corresponding with the infarct seen on the CT and MRI.  Assessment:  88 year old woman with a prior history of hypertension, hypothyroidism, arrhythmia, prior dural AV fistula status post endovascular exclusion of most of the dural AV  fistula, presenting with complaints of confusion with last known well 2 days prior to presentation. MRI consistent with a right insula and frontal operculum infarct with head and neck blood vessel imaging showing focal severe stenosis of the proximal right superior M2 branch with distal reconstitution.  Given that there is no other area of significant stenosis or calcification, the focal stenosis could also represent a subocclusive thrombus-hard to say.  Does not look like an intramural unstable thrombus which might require heparinization.  Likely etiology of the stroke is the symptomatic M2 stenosis versus subocclusive thrombus  On my examination, she is fully alert awake oriented without any focal deficits at this time.  Impression: Right MCA infarct due to intracranial stenosis versus subocclusive thrombus Evaluate for cardioembolic etiology  Recommendations: Telemetry-May need outpatient continuous heart monitoring if no atrial fibrillation is seen on inpatient monitoring. Frequent neurochecks Aspirin 81 and Plavix 75 High intensity statin 2D echo A1c Lipid panel PT OT  Speech therapy I would allow permissive hypertension given the possible stenosis in the M2 division on the right for another few days before normalizing blood pressure.  Avoid hypotension. Stroke team to follow. -- Amie [place], MD Neurologist [facility] Pager: [phone]

## 2021-05-24 NOTE — ED Notes (Signed)
In to medicate patient with aspirin and give stroke mapping book. Patient in CT/MRI at this time. Son sitting at bedside awaiting patient's return. [facility] called for transport to room

## 2021-05-24 NOTE — Plan of Care (Signed)
Pt is alert oriented x4. Pt denies numbness or tingling. Strength equal bilaterally. No distress.   Problem: Education: Goal: Knowledge of General Education information will improve Description: Including pain rating scale, medication(s)/side effects and non-pharmacologic comfort measures Outcome: Progressing   Problem: Health Behavior/Discharge Planning: Goal: Ability to manage health-related needs will improve Outcome: Progressing   Problem: Clinical Measurements: Goal: Ability to maintain clinical measurements within normal limits will improve Outcome: Progressing Goal: Will remain free from infection Outcome: Progressing Goal: Diagnostic test results will improve Outcome: Progressing Goal: Respiratory complications will improve Outcome: Progressing Goal: Cardiovascular complication will be avoided Outcome: Progressing   Problem: Activity: Goal: Risk for activity intolerance will decrease Outcome: Progressing   Problem: Nutrition: Goal: Adequate nutrition will be maintained Outcome: Progressing   Problem: Coping: Goal: Level of anxiety will decrease Outcome: Progressing   Problem: Elimination: Goal: Will not experience complications related to bowel motility Outcome: Progressing Goal: Will not experience complications related to urinary retention Outcome: Progressing   Problem: Pain Managment: Goal: General experience of comfort will improve Outcome: Progressing   Problem: Safety: Goal: Ability to remain free from injury will improve Outcome: Progressing   Problem: Skin Integrity: Goal: Risk for impaired skin integrity will decrease Outcome: Progressing

## 2021-05-24 NOTE — ED Notes (Signed)
ED TO INPATIENT HANDOFF REPORT   S Name/Age/Gender Carmen [place] 88 y.o. female Room/Bed: WA21/WA21  Code Status   Code Status: Full Code  Home/SNF/Other Home Patient oriented to: self, place, time, and situation Is this baseline? Yes   Triage Complete: Triage complete  Chief Complaint Stroke [facility]) [I63.9]  Triage Note "Son stated around 2100 last night she became more lethargic and was slow to answer questions, patient states she is sleepy this morning and has generalized weakness and had to lower herself to the bathroom floor this morning and was unable to get herself back up" per EMS  Patient A&O x 4 on arrival, generalized weakness and drowsiness noted. No other complaints   Allergies Allergies  Allergen Reactions   Meperidine Hcl Nausea And Vomiting   Nitrofurantoin Nausea And Vomiting   Sulfa Antibiotics Nausea And Vomiting    Level of Care/Admitting Diagnosis ED Disposition     ED Disposition  Admit   Condition  --   Comment  Hospital Area: [place] [[id number]]  Level of Care: Telemetry Medical [104]  May admit patient to [facility] or [facility] if equivalent level of care is available:: No  Covid Evaluation: Confirmed COVID Negative  Diagnosis: Stroke [facility]) [[id number]]  Admitting Physician: Aline August [[id number]]  Attending Physician: Aline August [[id number]]  Estimated length of stay: 3 - 4 days  Certification:: I certify this patient will need inpatient services for at least 2 midnights          B Medical/Surgery History Past Medical History:  Diagnosis Date   Arthritis    Dental crowns present    Dysrhythmia 2012   cardiac ablation   Hypertension    Hypothyroidism    Wears glasses    Past Surgical History:  Procedure Laterality Date   ABLATION OF DYSRHYTHMIC FOCUS  2012   [facility]   CARPAL TUNNEL RELEASE Left 08/20/2013   Procedure: LEFT CARPAL TUNNEL RELEASE;  Surgeon: [name], MD;  Location: [facility];  Service: Orthopedics;  Laterality: Left;   CESAREAN SECTION  [phone]   x4   EYE SURGERY     both cataracts   LUMBAR FUSION  2009   TOTAL HIP ARTHROPLASTY  2008   left   TOTAL HIP ARTHROPLASTY  2007   right     A IV Location/Drains/Wounds Patient Lines/Drains/Airways Status     Active Line/Drains/Airways     Name Placement date Placement time Site Days   Peripheral IV 05/24/21 Anterior;Left;Proximal Forearm 05/24/21  0950  Forearm  less than 1   External Urinary Catheter 05/24/21  1237  --  less than 1   Incision (Closed) 08/20/13 Hand Left 08/20/13  1233  -- 2834            Intake/Output Last 24 hours No intake or output data in the 24 hours ending 05/24/21 1650  Labs/Imaging Results for orders placed or performed during the hospital encounter of 05/24/21 (from the past 48 hour(s))  Urinalysis, Routine w reflex microscopic Nasopharyngeal Swab     Status: Abnormal   Collection Time: 05/24/21 11:37 AM  Result Value Ref Range   Color, Urine YELLOW YELLOW   APPearance CLEAR CLEAR   Specific Gravity, Urine 1.015 1.005 - 1.030   pH 8.0 5.0 - 8.0   Glucose, UA NEGATIVE NEGATIVE mg/dL   Hgb urine dipstick NEGATIVE NEGATIVE   Bilirubin Urine NEGATIVE NEGATIVE   Ketones, ur NEGATIVE NEGATIVE mg/dL   Protein, ur NEGATIVE NEGATIVE  mg/dL   Nitrite NEGATIVE NEGATIVE   Leukocytes,Ua LARGE (A) NEGATIVE    Comment: Performed at [facility], [place] [address] [place], [place] [address]  Urinalysis, Microscopic (reflex)     Status: Abnormal   Collection Time: 05/24/21 11:37 AM  Result Value Ref Range   RBC / HPF 0-5 0 - 5 RBC/hpf   WBC, UA 0-5 0 - 5 WBC/hpf   Bacteria, UA FEW (A) NONE SEEN   Squamous Epithelial / LPF 0-5 0 - 5   Urine-Other MICROSCOPIC EXAM PERFORMED ON UNCONCENTRATED URINE     Comment: LESS THAN 10 mL OF URINE SUBMITTED Performed at [facility], [place] [address] [place], [place] [address]   CBC with  Differential     Status: None   Collection Time: 05/24/21 11:55 AM  Result Value Ref Range   WBC 9.2 4.0 - 10.5 K/uL   RBC 4.03 3.87 - 5.11 MIL/uL   Hemoglobin 12.4 12.0 - 15.0 g/dL   HCT 37.6 36.0 - 46.0 %   MCV 93.3 80.0 - 100.0 fL   MCH 30.8 26.0 - 34.0 pg   MCHC 33.0 30.0 - 36.0 g/dL   RDW 13.9 11.5 - 15.5 %   Platelets 197 150 - 400 K/uL   nRBC 0.0 0.0 - 0.2 %   Neutrophils Relative % 80 %   Neutro Abs 7.4 1.7 - 7.7 K/uL   Lymphocytes Relative 13 %   Lymphs Abs 1.2 0.7 - 4.0 K/uL   Monocytes Relative 6 %   Monocytes Absolute 0.5 0.1 - 1.0 K/uL   Eosinophils Relative 0 %   Eosinophils Absolute 0.0 0.0 - 0.5 K/uL   Basophils Relative 0 %   Basophils Absolute 0.0 0.0 - 0.1 K/uL   Immature Granulocytes 1 %   Abs Immature Granulocytes 0.05 0.00 - 0.07 K/uL    Comment: Performed at [facility], [place] [address] [place], [place] [address]  Comprehensive metabolic panel     Status: Abnormal   Collection Time: 05/24/21 11:55 AM  Result Value Ref Range   Sodium 136 135 - 145 mmol/L   Potassium 3.7 3.5 - 5.1 mmol/L   Chloride 103 98 - 111 mmol/L   CO2 27 22 - 32 mmol/L   Glucose, Bld 112 (H) 70 - 99 mg/dL    Comment: Glucose reference range applies only to samples taken after fasting for at least 8 hours.   BUN 15 8 - 23 mg/dL   Creatinine, Ser 0.82 0.44 - 1.00 mg/dL   Calcium 9.1 8.9 - 10.3 mg/dL   Total Protein 7.3 6.5 - 8.1 g/dL   Albumin 3.9 3.5 - 5.0 g/dL   AST 25 15 - 41 U/L   ALT 20 0 - 44 U/L   Alkaline Phosphatase 47 38 - 126 U/L   Total Bilirubin 0.6 0.3 - 1.2 mg/dL   GFR, Estimated >60 >60 mL/min    Comment: (NOTE) Calculated using the CKD-EPI Creatinine Equation (2021)    Anion gap 6 5 - 15    Comment: Performed at [facility], [place] [address], [place],  [address]  Resp Panel by RT-PCR (Flu A&B, Covid) Nasopharyngeal Swab     Status: None   Collection Time: 05/24/21 11:55 AM   Specimen: Nasopharyngeal Swab;  Nasopharyngeal(NP) swabs in vial transport medium  Result Value Ref Range   SARS Coronavirus 2 by RT PCR NEGATIVE NEGATIVE    Comment: (NOTE) SARS-CoV-2 target nucleic acids are NOT  DETECTED.  The SARS-CoV-2 RNA is generally detectable in upper respiratory specimens during the acute phase of infection. The lowest concentration of SARS-CoV-2 viral copies this assay can detect is 138 copies/mL. A negative result does not preclude SARS-Cov-2 infection and should not be used as the sole basis for treatment or other patient management decisions. A negative result may occur with  improper specimen collection/handling, submission of specimen other than nasopharyngeal swab, presence of viral mutation(s) within the areas targeted by this assay, and inadequate number of viral copies(<138 copies/mL). A negative result must be combined with clinical observations, patient history, and epidemiological information. The expected result is Negative.  Fact Sheet for Patients:  EntrepreneurPulse.com.au  Fact Sheet for Healthcare Providers:  IncredibleEmployment.be  This test is no t yet approved or cleared by the [place] FDA and  has been authorized for detection and/or diagnosis of SARS-CoV-2 by FDA under an Emergency Use Authorization (EUA). This EUA will remain  in effect (meaning this test can be used) for the duration of the COVID-19 declaration under Section 564(b)(1) of the Act, 21 U.S.C.section 360bbb-3(b)(1), unless the authorization is terminated  or revoked sooner.       Influenza A by PCR NEGATIVE NEGATIVE   Influenza B by PCR NEGATIVE NEGATIVE    Comment: (NOTE) The Xpert Xpress SARS-CoV-2/FLU/RSV plus assay is intended as an aid in the diagnosis of influenza from Nasopharyngeal swab specimens and should not be used as a sole basis for treatment. Nasal washings and aspirates are unacceptable for Xpert Xpress  SARS-CoV-2/FLU/RSV testing.  Fact Sheet for Patients: EntrepreneurPulse.com.au  Fact Sheet for Healthcare Providers: IncredibleEmployment.be  This test is not yet approved or cleared by the [place] FDA and has been authorized for detection and/or diagnosis of SARS-CoV-2 by FDA under an Emergency Use Authorization (EUA). This EUA will remain in effect (meaning this test can be used) for the duration of the COVID-19 declaration under Section 564(b)(1) of the Act, 21 U.S.C. section 360bbb-3(b)(1), unless the authorization is terminated or revoked.  Performed at [facility], [place] [address], [place], [place] [address]   Ammonia     Status: None   Collection Time: 05/24/21 11:55 AM  Result Value Ref Range   Ammonia <10 9 - 35 umol/L    Comment: Performed at [facility], [place] [address] [place], [place] [address]   CT Head Wo Contrast  Result Date: 05/24/2021 CLINICAL DATA:  Altered mental status, lethargy starting at 9 p.m. last night EXAM: CT HEAD WITHOUT CONTRAST TECHNIQUE: Contiguous axial images were obtained from the base of the skull through the vertex without intravenous contrast. RADIATION DOSE REDUCTION: This exam was performed according to the departmental dose-optimization program which includes automated exposure control, adjustment of the mA and/or kV according to patient size and/or use of iterative reconstruction technique. COMPARISON:  None. FINDINGS: Brain: There is confluent hypodensity in the right frontal operculum consistent with acute to early subacute infarct. ASPECTS score 7. There is no evidence of hemorrhagic transformation. There is no other evidence of acute infarct. Background parenchymal volume is normal for age. There is no significant burden of white matter microangiopathic change by CT. There is no mass lesion.  There is no midline shift. Vascular: No definite dense vessel sign is  seen. Skull: Normal. Negative for fracture or focal lesion. Sinuses/Orbits: The imaged paranasal sinuses are clear. Bilateral lens implants are in place. The globes and orbits are otherwise unremarkable. Other: Presumed vascular coils are seen in the  left suboccipital soft tissues. IMPRESSION: 1. Acute to early subacute infarct in the right insula and frontal operculum in the MCA distribution. 2. ASPECTS score 7. These results were called by telephone at the time of interpretation on 05/24/2021 at 2:54 Pm to provider [facility] , who verbally acknowledged these results. Electronically Signed   By: Valetta Mole M.D.   On: 05/24/2021 15:04    Pending Labs Unresulted Labs (From admission, onward)     Start     Ordered   05/25/21 0500  Hemoglobin A1c  (Labs)  Tomorrow morning,   R        05/24/21 1556   05/25/21 0500  Lipid panel  (Labs)  Tomorrow morning,   R       Comments: Fasting    05/24/21 1556   05/24/21 1351  Urine Culture  Once,   STAT       Question:  Indication  Answer:  Altered mental status (if no other cause identified)   05/24/21 1350            Vitals/Pain Today's Vitals   05/24/21 1300 05/24/21 1408 05/24/21 1415 05/24/21 1500  BP:  138/68 138/70 (!) 143/61  Pulse: 89 88 87 92  Resp: 14 (!) 21 (!) 26 17  Temp:      TempSrc:      SpO2: 97% 95% 95% 98%  Weight:      Height:      PainSc:        Isolation Precautions No active isolations  Medications Medications  levothyroxine (SYNTHROID) tablet 50 mcg (has no administration in time range)   stroke: mapping our early stages of recovery book (has no administration in time range)  acetaminophen (TYLENOL) tablet 650 mg (has no administration in time range)    Or  acetaminophen (TYLENOL) 160 MG/5ML solution 650 mg (has no administration in time range)    Or  acetaminophen (TYLENOL) suppository 650 mg (has no administration in time range)  senna-docusate (Senokot-S) tablet 1 tablet (has no administration in time  range)  aspirin suppository 300 mg (has no administration in time range)    Or  aspirin tablet 325 mg (has no administration in time range)  atorvastatin (LIPITOR) tablet 80 mg (has no administration in time range)  iohexol (OMNIPAQUE) 350 MG/ML injection 60 mL (60 mLs Intravenous Contrast Given 05/24/21 1626)    Mobility walks with device Low fall risk

## 2021-05-24 NOTE — H&P (Signed)
History and Physical    Patient: Carmen Hammond WIO:[id number] DOB: 1929-03-31 DOA: 05/24/2021 DOS: the patient was seen and examined on 05/24/2021 PCP: Chesley Noon, MD  Patient coming from: Home  Chief Complaint:  Confusion  HPI: Carmen Hammond is a 88 y.o. female with medical history significant of hypertension, hypothyroidism presented with confusion.  Patient does not provide much history and son at bedside provides history.  As per the son, patient is usually independent and high functioning with good memory and still drives.  Over the last 2 days, she has had some confusion, has mixed up some of her medications and seems off from her baseline.  No fever, dysuria, hematuria, cough, chest pain, abdominal pain, loss of consciousness or seizures.  Patient woke up this morning more confused and son noted some slurring of speech.  Earlier this morning, she felt very fatigued and lowered herself down to the ground.  No fall or head injury.  Son found her in the bathroom.  ED course: -UA was unremarkable.  Blood work was unremarkable.  CT of the head was suspicious for acute/subacute infarct.  ED provider spoke to neurology who recommended completion of stroke work-up and transfer to [facility].  Hospitalist service was called to evaluate the patient.  Review of Systems: unable to review all systems due to the inability of the patient to answer questions. Past Medical History:  Diagnosis Date   Arthritis    Dental crowns present    Dysrhythmia 2012   cardiac ablation   Hypertension    Hypothyroidism    Wears glasses    Past Surgical History:  Procedure Laterality Date   ABLATION OF DYSRHYTHMIC FOCUS  2012   [facility]   CARPAL TUNNEL RELEASE Left 08/20/2013   Procedure: LEFT CARPAL TUNNEL RELEASE;  Surgeon: [name], MD;  Location: [place];  Service: Orthopedics;  Laterality: Left;   CESAREAN SECTION  [id number]   x4   EYE SURGERY     both  cataracts   LUMBAR FUSION  2009   TOTAL HIP ARTHROPLASTY  2008   left   TOTAL HIP ARTHROPLASTY  2007   right   Social History:  reports that she has never smoked. She does not have any smokeless tobacco history on file. She reports that she does not drink alcohol and does not use drugs.  Allergies: No known drug allergies  History reviewed. No pertinent family history.  No history of cancer or TB in the family.  Prior to Admission medications   Medication Sig Start Date End Date Taking? Authorizing Provider  aspirin 81 MG tablet Take 81 mg by mouth daily.    [provider]  levothyroxine (SYNTHROID, LEVOTHROID) 50 MCG tablet Take 50 mcg by mouth daily before breakfast.    [provider]  metoprolol succinate (TOPROL-XL) 50 MG 24 hr tablet Take 50 mg by mouth 2 (two) times daily. Take with or immediately following a meal.    [provider]  traMADol (ULTRAM) 50 MG tablet Take by mouth every 6 (six) hours as needed.    [provider]  traMADol (ULTRAM) 50 MG tablet Take 1 tablet (50 mg total) by mouth every 6 (six) hours as needed. 08/20/13   Daryll Brod, MD    Physical Exam: Vitals:   05/24/21 1300 05/24/21 1408 05/24/21 1415 05/24/21 1500  BP:  138/68 138/70 (!) 143/61  Pulse: 89 88 87 92  Resp: 14 (!) 21 (!) 26 17  Temp:      TempSrc:      SpO2: 97% 95% 95% 98%  Weight:      Height:       General: No acute distress, currently on room air. ENT/neck: No elevated JVD.  No obvious masses  respiratory: Bilateral decreased breath sounds at bases with some scattered crackles.  Intermittently tachypneic CVS: S1-S2 heard, rate controlled Abdominal: Soft, nontender, nondistended, no organomegaly, bowel sounds heard Extremities: No cyanosis, clubbing, edema CNS: Sleepy, wakes up slightly, slow to respond but responds appropriately.  No focal neurologic deficit.  Moving extremities. Lymph: No cervical lymphadenopathy Skin: No rashes, lesions,  ulcers Psych: Affect is mostly flat.  No signs of agitation. Musculoskeletal: No obvious joint deformity/tenderness/swelling   Data Reviewed: I have reviewed patient's investigation including blood work and imaging myself.  CT of the head showed acute to subacute infarct in the MCA distribution.  Sodium of 136, potassium of 3.7, creatinine of 0.82, WBC of 9.2, platelets of 197.  UA was not suggestive of UTI. I reviewed the EKG myself: No ST elevations or depressions.  Sinus rhythm.  Assessment and Plan:  Acute versus subacute MCA stroke -Presented with confusion and CT of the brain showed acute to subacute infarct in the MCA distribution. -UA was not suggestive of UTI. -Neurology recommended transfer to [facility] for completion of stroke work-up.  MRI of brain/CT angio of head and neck.  2D echo. -PT/OT/SLP evaluation -Allow for permissive hypertension. -Check lipid profile and hemoglobin A1c in a.m. -Aspirin and Lipitor for now -Monitor mental status.  Fall precautions.  Hypertension -Monitor blood pressure.  Allow for permissive hypertension  Hypothyroidism -Resume home regimen in a.m.       Advance Care Planning:   Code Status: Full Code   Consults: Neurology called by ED provider  Family Communication: Son at bedside  Severity of Illness: The appropriate patient status for this patient is INPATIENT. Inpatient status is judged to be reasonable and necessary in order to provide the required intensity of service to ensure the patient's safety. The patient's presenting symptoms, physical exam findings, and initial radiographic and laboratory data in the context of their chronic comorbidities is felt to place them at high risk for further clinical deterioration. Furthermore, it is not anticipated that the patient will be medically stable for discharge from the hospital within 2 midnights of admission.   * I certify that at the point of admission it is my clinical  judgment that the patient will require inpatient hospital care spanning beyond 2 midnights from the point of admission due to high intensity of service, high risk for further deterioration and high frequency of surveillance required.*  Author: Aline August, MD 05/24/2021 4:12 PM  For on call review [email or web address].

## 2021-05-24 NOTE — Assessment & Plan Note (Addendum)
LeftCT angiogram noting severe stenosis of prox M2 segment.  Still possibly could be embolic.  No signs of atrial fibrillation.  May end up needing event monitor.  PT recommending CIR, although patient physically appears to be improving rather quickly and after physical therapy reassessment, patient will go home with home health PT. she had previously been on 81 mg aspirin daily, but this had been stopped a few months back prior to her CVA.  Being restarted 325 p.o. daily.  She also be on Plavix 75 mg p.o. daily x3 months.  Statin at 20 mg daily.

## 2021-05-24 NOTE — ED Triage Notes (Signed)
"  Son stated around 2100 last night she became more lethargic and was slow to answer questions, patient states she is sleepy this morning and has generalized weakness and had to lower herself to the bathroom floor this morning and was unable to get herself back up" per EMS

## 2021-05-24 NOTE — ED Provider Notes (Signed)
[facility] DEPT Provider Note   CSN: [id number] Arrival date & time: 05/24/21  1022     History  Chief Complaint  Patient presents with   Weakness    Carmen Hammond is a 88 y.o. female.  HPI  88 year old female with past medical history of hypothyroidism, HTN presents emergency department accompanied by family/sons with concern for being slightly confused compared to baseline.  Patient lives at home and is cared for by her sons.  They state that she is usually independent and high functioning, great memory.  Over the last 2 days she has had some confusion, has mixed up some of her medications and seems off from her baseline.  There is no specific complaint like fever, dysuria/foul-smelling urine, cough, chest pain, abdominal pain.  She is otherwise been in her usual state of health, eating and drinking at baseline however 1 son feels as if she is dehydrated.  Patient herself states that she feels slightly fatigued but otherwise baseline.  No other neurologic complaints.  Earlier today while in the bathroom she felt fatigued and lowered herself down to the ground.  Denies any fall or syncope, no head injury.  The son found her sitting on her side in the bathroom.  Home Medications Prior to Admission medications   Medication Sig Start Date End Date Taking? Authorizing Provider  aspirin 81 MG tablet Take 81 mg by mouth daily.    [provider]  levothyroxine (SYNTHROID, LEVOTHROID) 50 MCG tablet Take 50 mcg by mouth daily before breakfast.    [provider]  metoprolol succinate (TOPROL-XL) 50 MG 24 hr tablet Take 50 mg by mouth 2 (two) times daily. Take with or immediately following a meal.    [provider]  traMADol (ULTRAM) 50 MG tablet Take by mouth every 6 (six) hours as needed.    [provider]  traMADol (ULTRAM) 50 MG tablet Take 1 tablet (50 mg total) by mouth every 6 (six) hours as needed. 08/20/13   Daryll Brod, MD      Allergies    Patient has no allergy information on record.    Review of Systems   Review of Systems  Constitutional:  Positive for fatigue. Negative for fever.  Respiratory:  Negative for shortness of breath.   Cardiovascular:  Negative for chest pain.  Gastrointestinal:  Negative for abdominal pain, diarrhea and vomiting.  Genitourinary:  Negative for difficulty urinating, dysuria and frequency.  Musculoskeletal:  Negative for back pain.  Skin:  Negative for rash.  Neurological:  Negative for weakness and headaches.  Psychiatric/Behavioral:  Positive for confusion.    Physical Exam Updated Vital Signs BP (!) 159/72    Pulse 84    Temp 98.3 F (36.8 C) (Oral)    Resp 18    Ht 4\' 11"  (1.499 m)    Wt 71.7 kg    SpO2 99%    BMI 31.91 kg/m  Physical Exam Vitals and nursing note reviewed.  Constitutional:      General: She is not in acute distress.    Appearance: Normal appearance. She is not diaphoretic.  HENT:     Head: Normocephalic.     Mouth/Throat:     Mouth: Mucous membranes are moist.  Eyes:     Pupils: Pupils are equal, round, and reactive to light.  Cardiovascular:     Rate and Rhythm: Normal rate.  Pulmonary:     Effort: Pulmonary effort is normal. No respiratory distress.  Abdominal:  Palpations: Abdomen is soft.     Tenderness: There is no abdominal tenderness.  Musculoskeletal:     Cervical back: No rigidity.  Skin:    General: Skin is warm.  Neurological:     General: No focal deficit present.     Mental Status: She is alert and oriented to person, place, and time. Mental status is at baseline.  Psychiatric:        Mood and Affect: Mood normal.    ED Results / Procedures / Treatments   Labs (all labs ordered are listed, but only abnormal results are displayed) Labs Reviewed  COMPREHENSIVE METABOLIC PANEL - Abnormal; Notable for the following components:      Result Value   Glucose, Bld 112 (*)    All other components within normal  limits  URINALYSIS, ROUTINE W REFLEX MICROSCOPIC - Abnormal; Notable for the following components:   Leukocytes,Ua LARGE (*)    All other components within normal limits  URINALYSIS, MICROSCOPIC (REFLEX) - Abnormal; Notable for the following components:   Bacteria, UA FEW (*)    All other components within normal limits  RESP PANEL BY RT-PCR (FLU A&B, COVID) ARPGX2  URINE CULTURE  CBC WITH DIFFERENTIAL/PLATELET  AMMONIA    EKG EKG Interpretation  Date/Time:  Thursday May 24 2021 10:35:49 EST Ventricular Rate:  87 PR Interval:    QRS Duration: 103 QT Interval:  406 QTC Calculation: 489 R Axis:   8 Text Interpretation: Accelerated junctional rhythm Abnormal R-wave progression, early transition Nonspecific T abnormalities, lateral leads Borderline prolonged QT interval Most likely NSR Confirmed by Lavenia Atlas ([id number]) on 05/24/2021 11:58:17 AM  Radiology No results found.  Procedures .Critical Care Performed by: Lorelle Gibbs, DO Authorized by: Lorelle Gibbs, DO   Critical care provider statement:    Critical care time (minutes):  45   Critical care time was exclusive of:  Separately billable procedures and treating other patients   Critical care was necessary to treat or prevent imminent or life-threatening deterioration of the following conditions:  CNS failure or compromise   Critical care was time spent personally by me on the following activities:  Development of treatment plan with patient or surrogate, discussions with consultants, evaluation of patient's response to treatment, examination of patient, ordering and review of laboratory studies, ordering and review of radiographic studies, ordering and performing treatments and interventions, pulse oximetry, re-evaluation of patient's condition and review of old charts   I assumed direction of critical care for this patient from another provider in my specialty: no     Care discussed with: admitting provider       Medications Ordered in ED Medications - No data to display  ED Course/ Medical Decision Making/ A&P                           Medical Decision Making Amount and/or Complexity of Data Reviewed Labs: ordered. Radiology: ordered.   This patient presents to the ED for concern of confusion, this involves an extensive number of treatment options, and is a complaint that carries with it a high risk of complications and morbidity.  The differential diagnosis includes confusion, change in mental status, infection, metabolic encephalopathy, UTI, CVA   Additional history obtained: -Additional history obtained from sons at bedside -External records from outside source obtained and reviewed including: Chart review including previous notes, labs, imaging, consultation notes   Lab Tests: -I ordered, reviewed, and interpreted labs.  The pertinent  results include: Baseline lab evaluation, negative UA   EKG -Read as a junctional rhythm but appears to be sinus   Imaging Studies ordered: -I ordered imaging studies including head CT -I independently visualized and interpreted imaging which showed right MCA stroke -I agree with the radiologist interpretation   Consultations Obtained: I requested consultation with the neurology, Dr. Leonel Ramsay,  and discussed lab and imaging findings as well as pertinent plan - they recommend: CTA and MRI imaging and admission to the medical team at [facility] for stroke work-up   ED Course: 88 year old female presents emergency department after confusion for the last day.  There is no specific neurodeficit or localizing symptoms on complaint.  Initial work-up and lab work is reassuring, no metabolic encephalopathy, no UTI.  Patient has a mild droop at the left corner of the mouth the son believes may be normal but otherwise does not appear to have a focal neurodeficit.  May be some slight weakness in the left arm with flexion at the end of 10 seconds when  compared to the right.  CT of the head shows acute/subacute right MCA infarct.  Neurology was consulted, recommend further neurology imaging and medical admission.  Patient and son updated and agree with admission plan.   Critical Interventions: Neurology consult, stroke imaging, medical admission   Cardiac Monitoring: The patient was maintained on a cardiac monitor.  I personally viewed and interpreted the cardiac monitored which showed an underlying rhythm of: Appears sinus on the monitor   Reevaluation: After the interventions noted above, I reevaluated the patient and found that they have :stayed the same   Dispostion: Patients evaluation and results requires admission for further treatment and care.  Spoke with hospitalist, reviewed patient's ED course and they accept admission.  Patient agrees with admission plan, offers no new complaints and is stable/unchanged at time of admit.        Final Clinical Impression(s) / ED Diagnoses Final diagnoses:  None    Rx / DC Orders ED Discharge Orders     None         Lorelle Gibbs, DO 05/24/21 1635

## 2021-05-24 NOTE — ED Triage Notes (Signed)
Patient A&O x 4 on arrival, generalized weakness and drowsiness noted. No other complaints

## 2021-05-24 NOTE — ED Notes (Signed)
[facility] called for transport 

## 2021-05-24 NOTE — ED Notes (Signed)
Assisted to bed pan. Scant amount of yellow urine out. Specimen collected and sent to lab.

## 2021-05-25 ENCOUNTER — Inpatient Hospital Stay (HOSPITAL_COMMUNITY): Payer: Medicare Other

## 2021-05-25 ENCOUNTER — Other Ambulatory Visit: Payer: Self-pay | Admitting: Physician Assistant

## 2021-05-25 DIAGNOSIS — I6389 Other cerebral infarction: Secondary | ICD-10-CM

## 2021-05-25 DIAGNOSIS — E669 Obesity, unspecified: Secondary | ICD-10-CM

## 2021-05-25 DIAGNOSIS — E7800 Pure hypercholesterolemia, unspecified: Secondary | ICD-10-CM

## 2021-05-25 DIAGNOSIS — I63511 Cerebral infarction due to unspecified occlusion or stenosis of right middle cerebral artery: Secondary | ICD-10-CM

## 2021-05-25 DIAGNOSIS — G9341 Metabolic encephalopathy: Secondary | ICD-10-CM

## 2021-05-25 LAB — LIPID PANEL
Cholesterol: 195 mg/dL (ref 0–200)
HDL: 96 mg/dL (ref >40)
LDL Cholesterol: 88 mg/dL (ref 0–99)
Total CHOL/HDL Ratio: 2 RATIO
Triglycerides: 56 mg/dL (ref <150)
VLDL: 11 mg/dL (ref 0–40)

## 2021-05-25 LAB — URINE CULTURE: Culture: <10000 — AB

## 2021-05-25 LAB — ECHOCARDIOGRAM COMPLETE
AR max vel: 1.69 cm2
AV Area VTI: 1.63 cm2
AV Area mean vel: 1.59 cm2
AV Mean grad: 5 mmHg
AV Peak grad: 9.2 mmHg
Ao pk vel: 1.52 m/s
Area-P 1/2: 4.39 cm2
Height: 59 in
S' Lateral: 2.6 cm
Weight: 2528 oz

## 2021-05-25 LAB — HEMOGLOBIN A1C
Hgb A1c MFr Bld: 4.9 % (ref 4.8–5.6)
Mean Plasma Glucose: 93.93 mg/dL

## 2021-05-25 MED ORDER — ATORVASTATIN CALCIUM 10 MG PO TABS
20.0000 mg | ORAL_TABLET | Freq: Every day | ORAL | Status: DC
  Administered 2021-05-25 – 2021-05-27 (×3): 20 mg via ORAL
  Filled 2021-05-25 (×3): qty 2

## 2021-05-25 NOTE — Evaluation (Signed)
Occupational Therapy Evaluation Patient Details Name: Carmen Hammond MRN: [id number] DOB: 03-02-1929 Today's Date: 05/25/2021   History of Present Illness pt is a 88 y/o female admitted 2/23 with confusion, slurred speech and generally off from her baseline.  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  Another punctate infarct in the right centrum semiovale.  No hemorrhagic transformation.  PMHx: bil THA, HTN, arthritis, lumbar fusion.   Clinical Impression   PTA pt lives independently in a condo with her son. Pt was very active, managed her own finances and drove. Her son unfortunately collasped after EMS brought her to the hospital and he is also currently in the hospital (pt unaware). Feel pt would greatly benefit from rehab at [facility] to reach modified independent level goals to DC home with intermittent S. Discussed POC with pt/daughter.  Acute OT to follow.       Recommendations for follow up therapy are one component of a multi-disciplinary discharge planning process, led by the attending physician.  Recommendations may be updated based on patient status, additional functional criteria and insurance authorization.   Follow Up Recommendations  Acute inpatient rehab (3hours/day)    Assistance Recommended at Discharge Intermittent Supervision/Assistance  Patient can return home with the following A little help with walking and/or transfers;A little help with bathing/dressing/bathroom;Assistance with cooking/housework;Direct supervision/assist for medications management;Direct supervision/assist for financial management;Assist for transportation;Help with stairs or ramp for entrance    Functional Status Assessment  Patient has had a recent decline in their functional status and demonstrates the ability to make significant improvements in function in a reasonable and predictable amount of time.  Equipment  Recommendations  Tub/shower seat    Recommendations for Other Services Rehab consult     Precautions / Restrictions Precautions Precautions: Fall      Mobility Bed Mobility Overal bed mobility: Needs Assistance Bed Mobility: Supine to Sit     Supine to sit: Supervision     General bed mobility comments: no assist flat HOB, scooted well to EOB    Transfers Overall transfer level: Needs assistance   Transfers: Sit to/from Stand Sit to Stand: Min assist           General transfer comment: min stability assist from lowered bed.      Balance Overall balance assessment: Needs assistance Sitting-balance support: Feet supported, No upper extremity supported Sitting balance-Leahy Scale: Good     Standing balance support: Single extremity supported, No upper extremity supported, During functional activity Standing balance-Leahy Scale: Fair                             ADL either performed or assessed with clinical judgement   ADL Overall ADL's : Needs assistance/impaired Eating/Feeding: Modified independent   Grooming: Min guard;Standing   Upper Body Bathing: Sitting   Lower Body Bathing: Minimal assistance;Sit to/from stand   Upper Body Dressing : Minimal assistance;Sitting   Lower Body Dressing: Minimal assistance;Sit to/from stand   Toilet Transfer: Minimal assistance   Toileting- Clothing Manipulation and Hygiene: Minimal assistance       Functional mobility during ADLs: Minimal assistance       Vision Baseline Vision/History: 1 Wears glasses Patient Visual Report: Blurring of vision Additional Comments: glasses not in room; missed a few targets in L filed. Will continue to assess     Perception Perception Comments: mild L inattention  Praxis      Pertinent Vitals/Pain Pain Assessment Pain Assessment: No/denies pain     Hand Dominance Right   Extremity/Trunk Assessment Upper Extremity Assessment Upper Extremity  Assessment: LUE deficits/detail LUE Deficits / Details: mild weakness noted with "clumsiness" during tasks, but functional LUE Coordination: decreased fine motor   Lower Extremity Assessment Lower Extremity Assessment: Defer to PT evaluation LLE Deficits / Details: mildly weak, but functional and not focal. LLE Sensation: WNL   Cervical / Trunk Assessment Cervical / Trunk Assessment: Other exceptions (L bias at times wehn distracted)   Communication Communication Communication: No difficulties   Cognition Arousal/Alertness: Awake/alert Behavior During Therapy: WFL for tasks assessed/performed Overall Cognitive Status: Impaired/Different from baseline Area of Impairment: Attention, Safety/judgement, Awareness, Problem solving                   Current Attention Level: Selective     Safety/Judgement: Decreased awareness of safety, Decreased awareness of deficits Awareness: Emergent Problem Solving: Slow processing General Comments: Scored a 0/28 on the Short Blessed Test, which is WNL. Slow processingnoted during tasks. Decreased aawareness of safely/deficits. [place] [address] and was on floor before she was found by her son - pt does not remember.     General Comments  daughter present    Exercises     Shoulder Instructions      Home Living Family/patient expects to be discharged to:: Private residence Living Arrangements: Children;Other (Comment) (2 sons live with her in a townhome,) Available Help at Discharge: Family;Available 24 hours/day Type of Home: House Home Access: Stairs to enter [employer] of Steps: 3 Entrance Stairs-Rails: None Home Layout: Two level;Bed/bath upstairs Alternate Level Stairs-Number of Steps: flight Alternate Level Stairs-Rails: Right;Left Bathroom Shower/Tub: Occupational psychologist: Standard Bathroom Accessibility: Yes   Home Equipment: Conservation officer, nature (2 wheels);Cane - single point;BSC/3in1;Shower seat   Additional  Comments: wears glasses      Prior Functioning/Environment Prior Level of Function : Independent/Modified Independent;Driving             Mobility Comments: uses SPC most often out of the home.  RW for "arthritic" days, does use available stationary surfaces.  I to Mod I overall ADLs Comments: Manages her ADL's Independently        OT Problem List: Decreased strength;Decreased activity tolerance;Impaired balance (sitting and/or standing);Impaired vision/perception;Decreased coordination;Decreased safety awareness;Decreased cognition;Decreased knowledge of use of DME or AE      OT Treatment/Interventions: Self-care/ADL training;Therapeutic exercise;DME and/or AE instruction;Therapeutic activities;Cognitive remediation/compensation;Visual/perceptual remediation/compensation;Patient/family education;Balance training    OT Goals(Current goals can be found in the care plan section) Acute Rehab OT Goals Patient Stated Goal: to return home and be independent OT Goal Formulation: With patient/family Time For Goal Achievement: 06/08/21 Potential to Achieve Goals: Good  OT Frequency: Min 2X/week    Co-evaluation              AM-PAC OT "6 Clicks" Daily Activity     Outcome Measure Help from another person eating meals?: None Help from another person taking care of personal grooming?: A Little Help from another person toileting, which includes using toliet, bedpan, or urinal?: A Little Help from another person bathing (including washing, rinsing, drying)?: A Little Help from another person to put on and taking off regular upper body clothing?: A Little Help from another person to put on and taking off regular lower body clothing?: A Little 6 Click Score: 19   End of Session Equipment Utilized During Treatment: Gait belt Nurse Communication: Mobility status;Other (  comment) (DC needs)  Activity Tolerance: Patient tolerated treatment well Patient left: in chair;with call bell/phone  within reach;with family/visitor present  OT Visit Diagnosis: Unsteadiness on feet (R26.81);Other abnormalities of gait and mobility (R26.89);History of falling (Z91.81);Low vision, both eyes (H54.2);Other symptoms and signs involving cognitive function                Time: 2094-7096 OT Time Calculation (min): 30 min Charges:  OT General Charges $OT Visit: 1 Visit OT Evaluation $OT Eval Moderate Complexity: 1 Mod OT Treatments $Self Care/Home Management : 8-22 mins  Maurie Boettcher, OT/L   Acute OT Clinical Specialist Acute Rehabilitation Services Pager [phone] Office [phone]   [facility] 05/25/2021, 2:14 PM

## 2021-05-25 NOTE — Evaluation (Signed)
Physical Therapy Evaluation Patient Details Name: Carmen Hammond MRN: [id number] DOB: [birth year] Today's Date: 05/25/2021  History of Present Illness  pt is a 88 y/o female admitted 2/23 with confusion, slurred speech and generally off from her baseline.  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  Another punctate infarct in the right centrum semiovale.  No hemorrhagic transformation.  PMHx: bil THA, HTN, arthritis, lumbar fusion.  Clinical Impression  Pt admitted with/for s/s of stroke as stated above.  Pt needing min assist for activity OOB.  She is not at baseline functioning at this time..  Pt currently limited functionally due to the problems listed. ( See problems list.)   Pt will benefit from PT to maximize function and safety in order to get ready for next venue listed below.        Recommendations for follow up therapy are one component of a multi-disciplinary discharge planning process, led by the attending physician.  Recommendations may be updated based on patient status, additional functional criteria and insurance authorization.  Follow Up Recommendations Acute inpatient rehab (3hours/day)    Assistance Recommended at Discharge Intermittent Supervision/Assistance  Patient can return home with the following  A little help with walking and/or transfers;A little help with bathing/dressing/bathroom;Assistance with cooking/housework;Assist for transportation;Help with stairs or ramp for entrance    Equipment Recommendations None recommended by PT  Recommendations for Other Services  Rehab consult    Functional Status Assessment Patient has had a recent decline in their functional status and demonstrates the ability to make significant improvements in function in a reasonable and predictable amount of time.     Precautions / Restrictions Precautions Precautions: Fall      Mobility  Bed  Mobility Overal bed mobility: Needs Assistance Bed Mobility: Supine to Sit     Supine to sit: Supervision     General bed mobility comments: no assist flat HOB, scooted well to EOB    Transfers Overall transfer level: Needs assistance   Transfers: Sit to/from Stand Sit to Stand: Min assist           General transfer comment: min stability assist from lowered bed.    Ambulation/Gait Ambulation/Gait assistance: Min assist Gait Distance (Feet): 100 Feet Assistive device: 1 person hand held assist Gait Pattern/deviations: Step-through pattern   Gait velocity interpretation: <1.8 ft/sec, indicate of risk for recurrent falls   General Gait Details: mildly unsteady with antalgic appearing limp on the L LE in stance, but with fair heel/toe patter  Generally slower overall.  Gait stability degrades without external assist  Stairs            Wheelchair Mobility    Modified Rankin (Stroke Patients Only) Modified Rankin (Stroke Patients Only) Pre-Morbid Rankin Score: No symptoms Modified Rankin: Moderate disability     Balance Overall balance assessment: Needs assistance Sitting-balance support: Feet supported, No upper extremity supported Sitting balance-Leahy Scale: Good     Standing balance support: Single extremity supported, No upper extremity supported, During functional activity Standing balance-Leahy Scale: Fair                               Pertinent Vitals/Pain Pain Assessment Pain Assessment: No/denies pain    Home Living Family/patient expects to be discharged to:: Private residence Living Arrangements: Children;Other (Comment) (2 sons live with her in a townhome,) Available Help at Discharge:  Family;Available 24 hours/day Type of Home: House Home Access: Stairs to enter Entrance Stairs-Rails: None Entrance Stairs-Number of Steps: 3 Alternate Level Stairs-Number of Steps: flight Home Layout: Two level;Bed/bath upstairs Home  Equipment: Conservation officer, nature (2 wheels);Cane - single point;BSC/3in1;Shower seat Additional Comments: wears glasses    Prior Function Prior Level of Function : Independent/Modified Independent;Driving             Mobility Comments: uses SPC most often out of the home.  RW for "arthritic" days, does use available stationary surfaces.  I to Mod I overall ADLs Comments: Manages her ADL's Independently     Hand Dominance        Extremity/Trunk Assessment   Upper Extremity Assessment Upper Extremity Assessment: Defer to OT evaluation (general weakness  related soreness from arthritis)    Lower Extremity Assessment Lower Extremity Assessment: Overall WFL for tasks assessed;LLE deficits/detail LLE Deficits / Details: mildly weak, but functional and not focal. LLE Sensation: WNL       Communication   Communication: No difficulties  Cognition Arousal/Alertness: Awake/alert Behavior During Therapy: WFL for tasks assessed/performed Overall Cognitive Status: Within Functional Limits for tasks assessed                                 General Comments: Answered orientation questions with exception of  thought it was 28th of February.        General Comments General comments (skin integrity, edema, etc.): pt's dtr present.    Exercises     Assessment/Plan    PT Assessment Patient needs continued PT services  PT Problem List Decreased strength;Decreased activity tolerance;Decreased balance;Decreased mobility;Decreased coordination       PT Treatment Interventions Gait training;Stair training;Functional mobility training;Therapeutic activities;Balance training;Neuromuscular re-education;Patient/family education    PT Goals (Current goals can be found in the Care Plan section)  Acute Rehab PT Goals Patient Stated Goal: home and back to PLOF PT Goal Formulation: With patient Time For Goal Achievement: 06/08/21 Potential to Achieve Goals: Good    Frequency Min  3X/week     Co-evaluation               AM-PAC PT "6 Clicks" Mobility  Outcome Measure Help needed turning from your back to your side while in a flat bed without using bedrails?: A Little Help needed moving from lying on your back to sitting on the side of a flat bed without using bedrails?: A Little Help needed moving to and from a bed to a chair (including a wheelchair)?: A Little Help needed standing up from a chair using your arms (e.g., wheelchair or bedside chair)?: A Little Help needed to walk in hospital room?: A Little Help needed climbing 3-5 steps with a railing? : A Lot 6 Click Score: 17    End of Session   Activity Tolerance: Patient tolerated treatment well Patient left: in chair;with call bell/phone within reach;with family/visitor present Nurse Communication: Mobility status PT Visit Diagnosis: Other abnormalities of gait and mobility (R26.89);Difficulty in walking, not elsewhere classified (R26.2);Other symptoms and signs involving the nervous system (R29.898)    Time: 0623-7628 PT Time Calculation (min) (ACUTE ONLY): 44 min   Charges:   PT Evaluation $PT Eval Low Complexity: 1 Low PT Treatments $Gait Training: 8-22 mins $Therapeutic Activity: 8-22 mins        05/25/2021  Ginger Carne., PT Acute Rehabilitation Services [phone]  (pager) [phone]  (office)  Tessie Fass Ephram Kornegay 05/25/2021, 11:17  AM

## 2021-05-25 NOTE — Assessment & Plan Note (Signed)
Meets criteria with BMI greater than 30 ?

## 2021-05-25 NOTE — Hospital Course (Addendum)
And16 year old female with past medical history of hypertension hypothyroidism, but otherwise independent and in good health who presented to the emergency room on 2/23 with complaints of confusion x2 days along with slurred speech.  In the emergency room, CT scan noted acute versus subacute infarct in the MCA distribution and patient brought in for further evaluation of CVA.  MRI confirmed acute to early subacute infarct in the right insula and frontal operculum extending to corona radiata and additional punctate infarct in the right centrum semiovale.  CT angiogram note severe stenosis of the proximal M2 branch.

## 2021-05-25 NOTE — Progress Notes (Signed)
°  Transition of Care [facility]) Screening Note   Patient Details  Name: Carmen Hammond Date of Birth: [birth year]   Transition of Care [facility]) CM/SW Contact:    Geralynn Ochs, LCSW Phone Number: 05/25/2021, 2:20 PM    Transition of Care Department [facility]) has reviewed patient; pending workup for CIR. We will continue to monitor patient advancement through interdisciplinary progression rounds. If new patient transition needs arise, please place a TOC consult.

## 2021-05-25 NOTE — Progress Notes (Signed)
[facility] Progress Note  Patient: Carmen Hammond    ONG:[id number]  DOA: 05/24/2021    Date of Service: the patient was seen and examined on 05/25/2021  Brief hospital course: And55 year old female with past medical history of hypertension hypothyroidism, but otherwise independent and in good health who presented to the emergency room on 2/23 with complaints of confusion x2 days along with slurred speech.  In the emergency room, CT scan noted acute versus subacute infarct in the MCA distribution and patient brought in for further evaluation of CVA.  MRI confirmed acute to early subacute infarct in the right insula and frontal operculum extending to corona radiata and additional punctate infarct in the right centrum semiovale.  CT angiogram note severe stenosis of the proximal M2 branch.  Assessment and Plan: Assessment and Plan: * Stroke [facility])- (present on admission) CT angiogram noting severe stenosis of prox M2 segment.  Still possibly could be embolic.  No signs of atrial fibrillation.  May end up needing event monitor.  PT recommending CIR.   HTN (hypertension)- (present on admission) -Monitor blood pressure.  Allow for permissive hypertension  Hypothyroidism- (present on admission) Continue synthroid.  Obesity (BMI 30-39.9) Meets criteria with BMI greater than 30.   Body mass index is 31.91 kg/m.        Consultants: Neurology  Procedures: Echocardiogram: Results pending  Antimicrobials: None  Code Status: Full code   Subjective: Doing okay, no complaints other than feeling weak  Objective: Mildly elevated blood pressure Vitals:   05/25/21 0738 05/25/21 1128  BP: (!) 144/74 125/66  Pulse: 100 88  Resp: 16 18  Temp: 99.1 F (37.3 C) 98.1 F (36.7 C)  SpO2: 95% 96%    Intake/Output Summary (Last 24 hours) at 05/25/2021 1506 Last data filed at 05/25/2021 0934 Gross per 24 hour  Intake 240 ml  Output 1050 ml  Net -810 ml   Filed Weights   05/24/21  1034  Weight: 71.7 kg   Body mass index is 31.91 kg/m.  Exam:  General: Alert and oriented x3, no acute distress HEENT: Normocephalic and atraumatic, mucous membranes are slightly dry Cardiovascular: Regular rate and rhythm, S1-S2 Respiratory: Clear to auscultation bilaterally Abdomen: Soft, nontender, nondistended, positive bowel sounds Musculoskeletal: No clubbing or cyanosis, trace pitting edema Skin: No skin breaks, tears or lesions Psychiatry: Appropriate, no evidence of psychoses Neurology: Nonspecific weakness  Data Reviewed: LDL of 88: Goal of below 70  Disposition:  Status is: Inpatient Remains inpatient appropriate because: Disposition to be determined, potential CIR candidate      Family Communication: Daughter and son at the bedside DVT Prophylaxis:     Author: Annita Brod ,MD 05/25/2021 3:06 PM  To reach On-call, see care teams to locate the attending and reach out via [email or web address]. Between 7PM-7AM, please contact night-coverage If you still have difficulty reaching the attending provider, please page the [facility] (Director on Call) for [facility] on amion for assistance.

## 2021-05-25 NOTE — Progress Notes (Signed)
? ?  Inpatient Rehab Admissions Coordinator : ? ?Per therapy recommendations, patient was screened for CIR candidacy by Quintara Bost RN MSN.  At this time patient appears to be a potential candidate for CIR. I will place a rehab consult per protocol for full assessment. Please call me with any questions. ? ?Kallen Mccrystal RN MSN ?Admissions Coordinator ?[phone] ?  ?

## 2021-05-25 NOTE — Progress Notes (Addendum)
STROKE TEAM PROGRESS NOTE   SUBJECTIVE (INTERVAL HISTORY) Her daughter is at the bedside.  Overall her condition is gradually improving. Patient and daughter report that patient had become increasingly confused and ataxic over the past couple of days with nausea and emesis; as well, she had a fall prompting this admission. This morning, she reports resolution of symptoms with no nausea. Daughter reports that patient has not returned to speech baseline (still with increased latency) but has improved. She otherwise reports that her mom is largely at baseline.   CT head suspicious for acute/subacute infarct in the right MCA territory.  MRI was done that confirmed right MCA territory infarct.  OBJECTIVE CBC:  Recent Labs  Lab 05/24/21 1155  WBC 9.2  NEUTROABS 7.4  HGB 12.4  HCT 37.6  MCV 93.3  PLT 865   Basic Metabolic Panel:  Recent Labs  Lab 05/24/21 1155  NA 136  K 3.7  CL 103  CO2 27  GLUCOSE 112*  BUN 15  CREATININE 0.82  CALCIUM 9.1   Lipid Panel:  Recent Labs  Lab 05/25/21 0352  CHOL 195  TRIG 56  HDL 96  CHOLHDL 2.0  VLDL 11  LDLCALC 88   HgbA1c:  Recent Labs  Lab 05/25/21 0352  HGBA1C 4.9   Urine Drug Screen: No results for input(s): LABOPIA, COCAINSCRNUR, LABBENZ, AMPHETMU, THCU, LABBARB in the last 168 hours.  Alcohol Level No results for input(s): ETH in the last 168 hours.  IMAGING past 24 hours CT Angio Head W or Wo Contrast  Result Date: 05/24/2021 CLINICAL DATA:  Neuro deficit, right MCA stroke seen on CT. EXAM: CT ANGIOGRAPHY HEAD AND NECK TECHNIQUE: Multidetector CT imaging of the head and neck was performed using the standard protocol during bolus administration of intravenous contrast. Multiplanar CT image reconstructions and MIPs were obtained to evaluate the vascular anatomy. Carotid stenosis measurements (when applicable) are obtained utilizing NASCET criteria, using the distal internal carotid diameter as the denominator. RADIATION DOSE  REDUCTION: This exam was performed according to the departmental dose-optimization program which includes automated exposure control, adjustment of the mA and/or kV according to patient size and/or use of iterative reconstruction technique. CONTRAST:  23mL OMNIPAQUE IOHEXOL 350 MG/ML SOLN COMPARISON:  Same-day noncontrast head CT FINDINGS: CTA NECK FINDINGS Aortic arch: There is mild calcified atherosclerotic plaque of the aortic arch. The origins of the major branch vessels are patent. The subclavian arteries are patent. The main pulmonary artery is dilated measuring up to 3.5 cm. Right carotid system: The right common carotid artery is tortuous with a retropharyngeal course common but patent. There is mild calcified plaque at the bifurcation without significant stenosis or occlusion. There is a retropharyngeal course of the right ICA. There is no dissection or aneurysm. Left carotid system: The left common, internal, and external carotid arteries are patent, without hemodynamically significant stenosis or occlusion. There is no dissection or aneurysm. Vertebral arteries: The left vertebral artery is dominant, a normal variant. There is no significant stenosis or occlusion. There is no dissection or aneurysm. Skeleton: There is multilevel degenerative change of the cervical spine with grade 1 anterolisthesis of C3 on C4. There is no visible canal hematoma. There is no acute osseous abnormality or aggressive osseous lesion. Other neck: The soft tissues are unremarkable. Upper chest: Imaged lung apices are clear. Review of the MIP images confirms the above findings CTA HEAD FINDINGS Anterior circulation: There is mild calcification of the intracranial ICAs without hemodynamically significant stenosis. The right M1 segment  is patent. There is focal severe stenosis of a proximal superior M2 division (8-77), with distal reconstitution. The remainder of the M2 branches are patent. The left MCA and distal branches are  patent. The bilateral ACAs are patent. The anterior communicating artery is normal. There is no aneurysm or AVM. Posterior circulation: There is a PICA termination of the right vertebral artery, a normal variant. The dominant left vertebral artery is patent. PICA is identified on the left. The basilar artery is patent. Bilateral PCAs are patent. A small right posterior communicating artery is noted. Venous sinuses: Patent. Anatomic variants: Fall persistent trigeminal artery is noted. Review of the MIP images confirms the above findings IMPRESSION: 1. Focal severe stenosis of a proximal right superior M2 branch with distal reconstitution, corresponding to the infarct seen on prior CT. 2. Otherwise, patent vasculature of the head and neck. 3. Dilated main pulmonary artery measuring up to 3.5 cm suggesting pulmonary hypertension. Electronically Signed   By: Valetta Mole M.D.   On: 05/24/2021 16:53   CT ANGIO NECK W OR WO CONTRAST  Result Date: 05/24/2021 CLINICAL DATA:  Neuro deficit, right MCA stroke seen on CT. EXAM: CT ANGIOGRAPHY HEAD AND NECK TECHNIQUE: Multidetector CT imaging of the head and neck was performed using the standard protocol during bolus administration of intravenous contrast. Multiplanar CT image reconstructions and MIPs were obtained to evaluate the vascular anatomy. Carotid stenosis measurements (when applicable) are obtained utilizing NASCET criteria, using the distal internal carotid diameter as the denominator. RADIATION DOSE REDUCTION: This exam was performed according to the departmental dose-optimization program which includes automated exposure control, adjustment of the mA and/or kV according to patient size and/or use of iterative reconstruction technique. CONTRAST:  38mL OMNIPAQUE IOHEXOL 350 MG/ML SOLN COMPARISON:  Same-day noncontrast head CT FINDINGS: CTA NECK FINDINGS Aortic arch: There is mild calcified atherosclerotic plaque of the aortic arch. The origins of the major branch  vessels are patent. The subclavian arteries are patent. The main pulmonary artery is dilated measuring up to 3.5 cm. Right carotid system: The right common carotid artery is tortuous with a retropharyngeal course common but patent. There is mild calcified plaque at the bifurcation without significant stenosis or occlusion. There is a retropharyngeal course of the right ICA. There is no dissection or aneurysm. Left carotid system: The left common, internal, and external carotid arteries are patent, without hemodynamically significant stenosis or occlusion. There is no dissection or aneurysm. Vertebral arteries: The left vertebral artery is dominant, a normal variant. There is no significant stenosis or occlusion. There is no dissection or aneurysm. Skeleton: There is multilevel degenerative change of the cervical spine with grade 1 anterolisthesis of C3 on C4. There is no visible canal hematoma. There is no acute osseous abnormality or aggressive osseous lesion. Other neck: The soft tissues are unremarkable. Upper chest: Imaged lung apices are clear. Review of the MIP images confirms the above findings CTA HEAD FINDINGS Anterior circulation: There is mild calcification of the intracranial ICAs without hemodynamically significant stenosis. The right M1 segment is patent. There is focal severe stenosis of a proximal superior M2 division (8-77), with distal reconstitution. The remainder of the M2 branches are patent. The left MCA and distal branches are patent. The bilateral ACAs are patent. The anterior communicating artery is normal. There is no aneurysm or AVM. Posterior circulation: There is a PICA termination of the right vertebral artery, a normal variant. The dominant left vertebral artery is patent. PICA is identified on the left. The basilar  artery is patent. Bilateral PCAs are patent. A small right posterior communicating artery is noted. Venous sinuses: Patent. Anatomic variants: Fall persistent trigeminal  artery is noted. Review of the MIP images confirms the above findings IMPRESSION: 1. Focal severe stenosis of a proximal right superior M2 branch with distal reconstitution, corresponding to the infarct seen on prior CT. 2. Otherwise, patent vasculature of the head and neck. 3. Dilated main pulmonary artery measuring up to 3.5 cm suggesting pulmonary hypertension. Electronically Signed   By: Valetta Mole M.D.   On: 05/24/2021 16:53   MR BRAIN WO CONTRAST  Result Date: 05/24/2021 CLINICAL DATA:  Stroke suspected, infarct seen on prior CT EXAM: MRI HEAD WITHOUT CONTRAST TECHNIQUE: Multiplanar, multiecho pulse sequences of the brain and surrounding structures were obtained without intravenous contrast. COMPARISON:  Same-day noncontrast head CT FINDINGS: Brain: There is diffusion restriction with associated T2/FLAIR signal abnormality in the right insula and frontal operculum extending to the corona radiata consistent with acute to early subacute infarct. There is an additional punctate infarct in the right centrum semiovale. There is no evidence of hemorrhagic transformation. Background parenchymal volume is within normal limits for age. The ventricles are normal in size. Small foci of FLAIR signal abnormality in the subcortical and periventricular white matter likely reflects sequela of mild chronic white matter microangiopathy. There is a single punctate chronic microhemorrhage in the right frontal lobe, nonspecific. There is no mass lesion.  There is no midline shift. Vascular: Normal flow voids. Skull and upper cervical spine: Normal marrow signal. Sinuses/Orbits: The paranasal sinuses are clear. Bilateral lens implants are in place. The globes and orbits are otherwise unremarkable. Other: None. IMPRESSION: Acute to early subacute infarct in the right insula and frontal operculum extending to the corona radiata in the MCA distribution, and additional punctate infarct in the right centrum semiovale. No  hemorrhagic transformation. Electronically Signed   By: Valetta Mole M.D.   On: 05/24/2021 17:24   ECHOCARDIOGRAM COMPLETE  Result Date: 05/25/2021    ECHOCARDIOGRAM REPORT   Patient Name:   Carmen Hammond Date of Exam: 05/25/2021 Medical Rec #:  [id number]        Height:       59.0 in Accession #:    [id number]       Weight:       158.0 lb Date of Birth:  [birth year]        BSA:          1.669 m Patient Age:    31 years         BP:           125/66 mmHg Patient Gender: F                HR:           98 bpm. Exam Location:  Outpatient Procedure: 2D Echo, Cardiac Doppler and Color Doppler Indications:    Stroke  History:        Patient has no prior history of Echocardiogram examinations.                 Risk Factors:Hypertension. S/p ablation.  Sonographer:    Clayton Lefort RDCS (AE) Referring Phys: [id number] [place]  1. Left ventricular ejection fraction, by estimation, is 60 to 65%. The left ventricle has normal function. The left ventricle has no regional wall motion abnormalities. There is mild left ventricular hypertrophy. Left ventricular diastolic parameters are consistent with Grade II diastolic dysfunction (pseudonormalization).  2.  Right ventricular systolic function is normal. The right ventricular size is normal.  3. Left atrial size was severely dilated.  4. Right atrial size was moderately dilated.  5. The mitral valve is normal in structure. Trivial mitral valve regurgitation. No evidence of mitral stenosis.  6. The aortic valve was not well visualized. Aortic valve regurgitation is not visualized. Mild aortic valve stenosis. Aortic valve area, by VTI measures 1.63 cm. Aortic valve mean gradient measures 5.0 mmHg. Aortic valve Vmax measures 1.52 m/s.  7. The inferior vena cava is normal in size with greater than 50% respiratory variability, suggesting right atrial pressure of 3 mmHg. FINDINGS  Left Ventricle: Left ventricular ejection fraction, by estimation, is 60 to 65%. The left  ventricle has normal function. The left ventricle has no regional wall motion abnormalities. The left ventricular internal cavity size was normal in size. There is  mild left ventricular hypertrophy. Left ventricular diastolic parameters are consistent with Grade II diastolic dysfunction (pseudonormalization). Right Ventricle: The right ventricular size is normal. No increase in right ventricular wall thickness. Right ventricular systolic function is normal. Left Atrium: Left atrial size was severely dilated. Right Atrium: Right atrial size was moderately dilated. Pericardium: There is no evidence of pericardial effusion. Mitral Valve: The mitral valve is normal in structure. Trivial mitral valve regurgitation. No evidence of mitral valve stenosis. Tricuspid Valve: The tricuspid valve is not well visualized. Tricuspid valve regurgitation is trivial. No evidence of tricuspid stenosis. Aortic Valve: The aortic valve was not well visualized. Aortic valve regurgitation is not visualized. Mild aortic stenosis is present. Aortic valve mean gradient measures 5.0 mmHg. Aortic valve peak gradient measures 9.2 mmHg. Aortic valve area, by VTI measures 1.63 cm. Pulmonic Valve: The pulmonic valve was normal in structure. Pulmonic valve regurgitation is not visualized. No evidence of pulmonic stenosis. Aorta: The aortic root is normal in size and structure. Venous: The inferior vena cava is normal in size with greater than 50% respiratory variability, suggesting right atrial pressure of 3 mmHg. IAS/Shunts: No atrial level shunt detected by color flow Doppler.  LEFT VENTRICLE PLAX 2D LVIDd:         3.80 cm   Diastology LVIDs:         2.60 cm   LV e' medial:    7.50 cm/s LV PW:         1.40 cm   LV E/e' medial:  12.3 LV IVS:        1.30 cm   LV e' lateral:   12.20 cm/s LVOT diam:     1.80 cm   LV E/e' lateral: 7.6 LV SV:         42 LV SV Index:   25 LVOT Area:     2.54 cm  RIGHT VENTRICLE             IVC RV Basal diam:  2.90 cm      IVC diam: 1.00 cm RV S prime:     14.00 cm/s TAPSE (M-mode): 1.9 cm LEFT ATRIUM             Index        RIGHT ATRIUM           Index LA diam:        3.10 cm 1.86 cm/m   RA Area:     14.10 cm LA Vol (A2C):   77.7 ml 46.57 ml/m  RA Volume:   34.20 ml  20.50 ml/m LA Vol (A4C):   52.8  ml 31.64 ml/m LA Biplane Vol: 67.9 ml 40.69 ml/m  AORTIC VALVE AV Area (Vmax):    1.69 cm AV Area (Vmean):   1.59 cm AV Area (VTI):     1.63 cm AV Vmax:           152.00 cm/s AV Vmean:          107.000 cm/s AV VTI:            0.261 m AV Peak Grad:      9.2 mmHg AV Mean Grad:      5.0 mmHg LVOT Vmax:         101.00 cm/s LVOT Vmean:        66.900 cm/s LVOT VTI:          0.167 m LVOT/AV VTI ratio: 0.64  AORTA Ao Root diam: 3.30 cm Ao Asc diam:  3.60 cm MITRAL VALVE MV Area (PHT): 4.39 cm    SHUNTS MV Decel Time: 173 msec    Systemic VTI:  0.17 m MV E velocity: 92.50 cm/s  Systemic Diam: 1.80 cm MV A velocity: 65.50 cm/s MV E/A ratio:  1.41 Glori Bickers MD Electronically signed by Glori Bickers MD Signature Date/Time: 05/25/2021/3:45:23 PM    Final      PHYSICAL EXAM Temp:  [97.8 F (36.6 C)-99.5 F (37.5 C)] 99 F (37.2 C) (02/24 1542) Pulse Rate:  [82-100] 85 (02/24 1542) Resp:  [16-22] 16 (02/24 1542) BP: (125-162)/(60-74) 132/60 (02/24 1542) SpO2:  [95 %-98 %] 97 % (02/24 1542)  General - Well nourished, well developed elderly woman in no apparent distress.   Mental Status -  Level of arousal and orientation to time, place, and person were intact. Language including expression, naming, repetition, comprehension was assessed and found intact. Attention span and concentration were normal. Recent and remote memory were largely intact. 2/3 recall Fund of Knowledge was assessed and was intact.  Cranial Nerves II - XII - II - Visual field intact OU. III, IV, VI - Extraocular movements intact. V - Facial sensation intact bilaterally. VII - Facial movement with mild flattening of L nasolabial fold;  otherwise symmetric. VIII - Hearing & vestibular intact bilaterally. X - Palate elevates symmetrically. XI - Chin turning & shoulder shrug intact bilaterally. XII - Tongue protrusion intact.  Motor Strength - The patients strength was normal in all extremities and pronator drift was absent.  Bulk was normal and fasciculations were absent.   Motor Tone - Muscle tone was assessed at the neck and appendages and was normal.  Sensory - Light touch was assessed and  symmetrical.    Coordination - The patient had orbited R hand around L. Mild slowing of L sided rapid movements (daughter says may be attributed to arthritis). Some difficulty with L HKS, which also may be attributed to arthritis.  Tremor was absent.  Gait and Station - deferred.    ASSESSMENT/PLAN Ms. Carmen Hammond is an independently living 88 y.o. female with history of HTN, hypothyroidism, and atrial tachycardia and PSVT s/p cardiac ablation presenting with a couple of days of confusion, ataxia, N/V.   Stroke: Right MCA infarct likely due to severe stenosis of M2, versus cardio embolic source Code Stroke CT head Acute/subacute infarct in R insula and frontal operculum in MCA distribution. ASPECTS 7. CTA head & neck Severe stenosis of proximal R M2 with distal reconstitution; otherwise patent vasculature. MRI  Acute/subacute infarct in R insula and frontal operculum extending to corona radiata in MCA distribution. Punctate  infarct in R centrum semiovale. 2D Echo LVEF 60-65%, mild LVH, Grade II diastolic dysfunction, LA severely dilated, RA moderately dilated, trivial MVR, mild aortic stenosis Recommend outpatient 30-day cardiac event monitoring to rule out A-fib. LDL 88 HgbA1c 4.9% VTE prophylaxis - SCDs when in bed No antithrombotic prior to admission, now on aspirin 325 mg daily and clopidogrel 75 mg daily x 3 months, then ASA alone. Therapy recommendations:  CIR Disposition:  Pending   Hypertension Home meds:   Diltiazem 120 mg, Metoprolol 50 mg BID Stable Goal SBP <160, but gradually normalize in 5-7 days Long-term BP goal normotensive  Hyperlipidemia Home meds:  N/A LDL 88, goal < 70 Continue Atorvastatin 20 mg  High intensity statin not indicated given advanced age and LDL near goal Continue statin at discharge  Other Stroke Risk Factors Advanced Age >/= 89  Obesity, Body mass index is 31.91 kg/m., BMI >/= 30 associated with increased stroke risk, recommend weight loss, diet and exercise as appropriate   Other Active Problems H/o PSVT s/p cardiac ablation in 2012 Recommend 30 day heart monitor to rule out AFib 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 Pt was seen and examined.   88 yo F with hx of HTN, PSVT s/p ablation x 2, AVF s/p embolization in 2021 admitted for confusion and slurry speech x 2 days.  CT and MRI showed right MCA infarct.  CTA head and neck showed right M2 severe stenosis.  EF 60 to 65%, creatinine 0.82, LDL 88, A1c 4.9.  UA negative.  Ammonia level negative.  On exam, patient sitting in chair, daughter at bedside.  Patient awake alert orientated x3.  No aphasia, follows simple commands.  Still has mild left facial droop and left arm and hand clumsiness.  Otherwise neuro intact.  Etiology for patient stroke likely due to right M2 stenosis large vessel source.  However cardioembolic source cannot be can be ruled out.  Recommend 30-day cardiac event monitor as outpatient to rule out A-fib.  Continue aspirin 325 and Plavix 75 DAPT for 3 months and then aspirin alone given intracranial stenosis.  Continue Lipitor 20.  PT/OT recommend CIR.  For detailed assessment and plan, please refer to above as I have  made changes wherever appropriate.   Neurology will sign off. Please call with questions. Pt will follow up with stroke clinic NP at [facility] in about 4 weeks. Thanks for the consult.   Rosalin Hawking, MD PhD Stroke Neurology 05/25/2021 7:35 PM    To contact Stroke Continuity provider, please refer to [email or web address] After hours, contact General Neurology

## 2021-05-25 NOTE — Progress Notes (Signed)
°  Echocardiogram 2D Echocardiogram has been performed.  Carmen Hammond 05/25/2021, 3:16 PM

## 2021-05-26 DIAGNOSIS — I5032 Chronic diastolic (congestive) heart failure: Secondary | ICD-10-CM | POA: Diagnosis present

## 2021-05-26 LAB — BRAIN NATRIURETIC PEPTIDE: B Natriuretic Peptide: 122.3 pg/mL — ABNORMAL HIGH (ref 0.0–100.0)

## 2021-05-26 MED ORDER — FUROSEMIDE 10 MG/ML IJ SOLN
20.0000 mg | Freq: Once | INTRAMUSCULAR | Status: AC
  Administered 2021-05-26: 20 mg via INTRAVENOUS
  Filled 2021-05-26: qty 4

## 2021-05-26 NOTE — Plan of Care (Signed)
Pt is alert oriented  x4. Pt has purewick in place. Denies pain. No distress noted.   Problem: Education: Goal: Knowledge of General Education information will improve Description: Including pain rating scale, medication(s)/side effects and non-pharmacologic comfort measures Outcome: Progressing   Problem: Health Behavior/Discharge Planning: Goal: Ability to manage health-related needs will improve Outcome: Progressing   Problem: Clinical Measurements: Goal: Ability to maintain clinical measurements within normal limits will improve Outcome: Progressing Goal: Will remain free from infection Outcome: Progressing Goal: Diagnostic test results will improve Outcome: Progressing Goal: Respiratory complications will improve Outcome: Progressing Goal: Cardiovascular complication will be avoided Outcome: Progressing   Problem: Activity: Goal: Risk for activity intolerance will decrease Outcome: Progressing   Problem: Nutrition: Goal: Adequate nutrition will be maintained Outcome: Progressing   Problem: Coping: Goal: Level of anxiety will decrease Outcome: Progressing   Problem: Elimination: Goal: Will not experience complications related to bowel motility Outcome: Progressing Goal: Will not experience complications related to urinary retention Outcome: Progressing   Problem: Pain Managment: Goal: General experience of comfort will improve Outcome: Progressing   Problem: Safety: Goal: Ability to remain free from injury will improve Outcome: Progressing   Problem: Skin Integrity: Goal: Risk for impaired skin integrity will decrease Outcome: Progressing

## 2021-05-26 NOTE — Progress Notes (Signed)
Physical Therapy Treatment Patient Details Name: Carmen Hammond MRN: [id number] DOB: September 29, 1928 Today's Date: 05/26/2021   History of Present Illness pt is a 88 y/o female admitted 2/23 with confusion, slurred speech and generally off from her baseline.  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  Another punctate infarct in the right centrum semiovale.  No hemorrhagic transformation.  PMHx: bil THA, HTN, arthritis, lumbar fusion.    PT Comments    Pt has made good strides toward goals.  Not at baseline.  Emphasis on normalization transitions to EOB, sit to stands, dynamic/static balance in standing and progression of gait with appropriate AD's.    Recommendations for follow up therapy are one component of a multi-disciplinary discharge planning process, led by the attending physician.  Recommendations may be updated based on patient status, additional functional criteria and insurance authorization.  Follow Up Recommendations  Acute inpatient rehab (3hours/day)     Assistance Recommended at Discharge Intermittent Supervision/Assistance  Patient can return home with the following A little help with walking and/or transfers;A little help with bathing/dressing/bathroom;Assistance with cooking/housework;Assist for transportation;Help with stairs or ramp for entrance   Equipment Recommendations  None recommended by PT    Recommendations for Other Services       Precautions / Restrictions Precautions Precautions: Fall     Mobility  Bed Mobility Overal bed mobility: Needs Assistance Bed Mobility: Supine to Sit     Supine to sit: Supervision (flat bed, need for rail without any warm up before moving)     General bed mobility comments: no assist flat HOB, scooted well to EOB    Transfers Overall transfer level: Needs assistance Equipment used: Straight cane Transfers: Sit to/from Stand Sit  to Stand: Min assist           General transfer comment: min stability assist from lowered bed.    Ambulation/Gait Ambulation/Gait assistance: Min assist Gait Distance (Feet): 100 Feet (x2 with standing rest to recover) Assistive device: Straight cane Gait Pattern/deviations: Step-through pattern   Gait velocity interpretation: <1.8 ft/sec, indicate of risk for recurrent falls   General Gait Details: mild unsteadiness, with notable "gimpy" or antalgic L LE, but with functional heel/toe patterning.   Stairs             Wheelchair Mobility    Modified Rankin (Stroke Patients Only) Modified Rankin (Stroke Patients Only) Modified Rankin: Moderate disability     Balance Overall balance assessment: Needs assistance Sitting-balance support: Feet supported, No upper extremity supported Sitting balance-Leahy Scale: Good       Standing balance-Leahy Scale: Fair                   Standardized Balance Assessment Standardized Balance Assessment : Berg Balance Test Berg Balance Test Sit to Stand: Able to stand using hands after several tries Standing Unsupported: Able to stand 2 minutes with supervision Sitting with Back Unsupported but Feet Supported on Floor or Stool: Able to sit safely and securely 2 minutes Stand to Sit: Controls descent by using hands Transfers: Able to transfer safely, definite need of hands From Standing, Reach Forward with Outstretched Arm: Can reach forward >12 cm safely (5") From Standing Position, Pick up Object from Floor: Able to pick up shoe, needs supervision From Standing Position, Turn to Look Behind Over each Shoulder: Looks behind one side only/other side shows less weight shift Turn 360 Degrees: Able to turn 360  degrees safely but slowly        Cognition Arousal/Alertness: Awake/alert Behavior During Therapy: WFL for tasks assessed/performed Overall Cognitive Status: Impaired/Different from baseline                      Current Attention Level: Selective     Safety/Judgement: Decreased awareness of safety, Decreased awareness of deficits Awareness: Emergent Problem Solving: Slow processing          Exercises      General Comments General comments (skin integrity, edema, etc.): Worked on dynamic balance using BERG activities at EOB, needing min to min guard assist  @@@ Pt's HR found to be tachy at 157 bpm witness.  HR decreased to 114 bpm over approx 3 min.  Assymptomatic to patient, but she associated with fatigue.      Pertinent Vitals/Pain Pain Assessment Pain Assessment: Faces Faces Pain Scale: Hurts little more Pain Location: L knee Pain Descriptors / Indicators: Sore, Guarding Pain Intervention(s): Monitored during session    Home Living     Available Help at Discharge: Family;Available 24 hours/day Type of Home: House                  Prior Function            PT Goals (current goals can now be found in the care plan section) Acute Rehab PT Goals Patient Stated Goal: home and back to PLOF PT Goal Formulation: With patient Time For Goal Achievement: 06/08/21 Potential to Achieve Goals: Good Progress towards PT goals: Progressing toward goals    Frequency    Min 3X/week      PT Plan Current plan remains appropriate    Co-evaluation              AM-PAC PT "6 Clicks" Mobility   Outcome Measure  Help needed turning from your back to your side while in a flat bed without using bedrails?: A Little Help needed moving from lying on your back to sitting on the side of a flat bed without using bedrails?: A Little Help needed moving to and from a bed to a chair (including a wheelchair)?: A Little Help needed standing up from a chair using your arms (e.g., wheelchair or bedside chair)?: A Little Help needed to walk in hospital room?: A Little Help needed climbing 3-5 steps with a railing? : A Lot 6 Click Score: 17    End of Session   Activity  Tolerance: Patient tolerated treatment well;Patient limited by fatigue Patient left: in chair;with call bell/phone within reach;with family/visitor present Nurse Communication: Mobility status PT Visit Diagnosis: Other abnormalities of gait and mobility (R26.89);Difficulty in walking, not elsewhere classified (R26.2);Other symptoms and signs involving the nervous system (R29.898)     Time: 3893-7342 PT Time Calculation (min) (ACUTE ONLY): 33 min  Charges:  $Gait Training: 8-22 mins $Therapeutic Activity: 8-22 mins                     05/26/2021  Ginger Carne., PT Acute Rehabilitation Services [phone]  (pager) [phone]  (office)   Carmen Hammond 05/26/2021, 10:51 AM

## 2021-05-26 NOTE — Assessment & Plan Note (Signed)
Secondary to CVA.  Resolved.

## 2021-05-26 NOTE — Evaluation (Signed)
Speech Language Pathology Evaluation Patient Details Name: Carmen Hammond MRN: [id number] DOB: July 24, 1928 Today's Date: 05/26/2021 Time: 4818-5631 SLP Time Calculation (min) (ACUTE ONLY): 25 min  Problem List:  Patient Active Problem List   Diagnosis Date Noted   Chronic diastolic CHF (congestive heart failure) ([place]) 05/26/2021   Obesity (BMI 30-39.9) 49/70/2637   Acute metabolic encephalopathy 85/88/5027   Stroke ([place]) 05/24/2021   HTN (hypertension) 05/24/2021   Hypothyroidism 05/24/2021   Past Medical History:  Past Medical History:  Diagnosis Date   Arthritis    Dental crowns present    Dysrhythmia 2012   cardiac ablation   Hypertension    Hypothyroidism    Wears glasses    Past Surgical History:  Past Surgical History:  Procedure Laterality Date   ABLATION OF DYSRHYTHMIC FOCUS  04/01/2010   [facility]   BRAIN SURGERY  07/19/2017   aneurysm endovascular exclusion   CARPAL TUNNEL RELEASE Left 08/20/2013   Procedure: LEFT CARPAL TUNNEL RELEASE;  Surgeon: [name], MD;  Location: [place];  Service: Orthopedics;  Laterality: Left;   CESAREAN SECTION  [phone]   x4   EYE SURGERY     both cataracts   LUMBAR FUSION  04/02/2007   TOTAL HIP ARTHROPLASTY  04/01/2006   left   TOTAL HIP ARTHROPLASTY  04/01/2005   right   HPI:  Patient  is a 88 y/o female admitted 2/23 with confusion, slurred speech and generally off from her baseline.  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  Another punctate infarct in the right centrum semiovale.  No hemorrhagic transformation.  PMHx: bil THA, HTN, arthritis, lumbar fusion.   Assessment / Plan / Recommendation Clinical Impression  Patient presenting with a mild cognitive impairment as per this evaluation. Speech, language and oral motor function all WFL. Patient participated in completing the [facility] Mental  Status exam) and received a score of 23 out of possible 30, placing her in category of "Mild Neurocognitive Disorder". She exhibited slow cognitive processing overall and main areas of difficulty were in delayed recall of words and delayed recall of short paragraph information. Patient reports (and confirmed by son) that she drives a little still and takes care of her finances. Although SLP is not recommending acute care level ST services, patient would benefit from [place] services at next venue of care (AIR is recommendation from both PT and OT).    SLP Assessment  SLP Recommendation/Assessment: All further Speech Lanaguage Pathology  needs can be addressed in the next venue of care SLP Visit Diagnosis: Cognitive communication deficit (R41.841)    Recommendations for follow up therapy are one component of a multi-disciplinary discharge planning process, led by the attending physician.  Recommendations may be updated based on patient status, additional functional criteria and insurance authorization.    Follow Up Recommendations  Acute inpatient rehab (3hours/day)    Assistance Recommended at Discharge  Intermittent Supervision/Assistance  Functional Status Assessment Patient has had a recent decline in their functional status and demonstrates the ability to make significant improvements in function in a reasonable and predictable amount of time.  Frequency and Duration     N/A      SLP Evaluation Cognition  Overall Cognitive Status: Impaired/Different from baseline Arousal/Alertness: Awake/alert Orientation Level: Oriented X4 Year: 2023 Month: February Day of Week: Correct Attention: Sustained Sustained Attention: Appears intact Memory: Impaired Memory Impairment: Storage deficit;Retrieval  deficit;Other (comment) (recalled 3 of 5 words after 2 minute delay) Awareness: Impaired Awareness Impairment: Anticipatory impairment Problem Solving:  (intact at basic level) Safety/Judgment: Other  (comment) Comments: patient slow processing and not demonstrating adequate awareness       Comprehension  Auditory Comprehension Overall Auditory Comprehension: Appears within functional limits for tasks assessed    Expression Expression Primary Mode of Expression: Verbal Verbal Expression Overall Verbal Expression: Appears within functional limits for tasks assessed   Oral / Motor  Oral Motor/Sensory Function Overall Oral Motor/Sensory Function: Within functional limits Motor Speech Overall Motor Speech: Appears within functional limits for tasks assessed Respiration: Within functional limits Resonance: Within functional limits Articulation: Within functional limitis Intelligibility: Intelligible Motor Planning: Witnin functional limits Motor Speech Errors: Not applicable           Sonia Baller, MA, CCC-SLP Speech Therapy

## 2021-05-26 NOTE — Progress Notes (Signed)
Inpatient Rehab Admissions:  Inpatient Rehab Consult received.  I met with patient and son, Glendell Docker at the bedside for rehabilitation assessment and to discuss goals and expectations of an inpatient rehab admission.  Both acknowledged understanding of CIR goals and expectations. Both interested in pt pursuing CIR. Glendell Docker confirmed that family will be able to provide 24/7 supervision/assistance for pt after discharge. Will continue to follow.  Signed: Gayland Curry, [place], [place] Admissions Coordinator [phone]

## 2021-05-26 NOTE — Assessment & Plan Note (Addendum)
Grade 2 diastolic dysfunction noted on echocardiogram.  BNP with minimal elevation, given 1 dose of Lasix

## 2021-05-26 NOTE — Progress Notes (Signed)
[facility] Progress Note  Patient: Carmen Hammond    AJO:[id number]  DOA: 05/24/2021    Date of Service: the patient was seen and examined on 05/26/2021  Brief hospital course: And35 year old female with past medical history of hypertension hypothyroidism, but otherwise independent and in good health who presented to the emergency room on 2/23 with complaints of confusion x2 days along with slurred speech.  In the emergency room, CT scan noted acute versus subacute infarct in the MCA distribution and patient brought in for further evaluation of CVA.  MRI confirmed acute to early subacute infarct in the right insula and frontal operculum extending to corona radiata and additional punctate infarct in the right centrum semiovale.  CT angiogram note severe stenosis of the proximal M2 branch.  Assessment and Plan: Assessment and Plan: * Stroke [place])- (present on admission) CT angiogram noting severe stenosis of prox M2 segment.  Still possibly could be embolic.  No signs of atrial fibrillation.  May end up needing event monitor.  PT recommending CIR.   Acute metabolic encephalopathy- (present on admission) Secondary to CVA.  Resolved.  HTN (hypertension)- (present on admission) -Monitor blood pressure.  Allow for permissive hypertension  Chronic diastolic CHF (congestive heart failure) ([place])- (present on admission) Grade 2 diastolic dysfunction noted on echocardiogram.  BNP with minimal elevation, so 1 dose of Lasix  Hypothyroidism- (present on admission) Continue synthroid.  Obesity (BMI 30-39.9)- (present on admission) Meets criteria with BMI greater than 30.   Body mass index is 31.91 kg/m.        Consultants: Neurology  Procedures: Echocardiogram: Grade 2 diastolic dysfunction  Antimicrobials: None  Code Status: Full code   Subjective: Patient feeling better, little bit more energy.  Objective: Mildly elevated blood pressure Vitals:   05/26/21 1123 05/26/21  1517  BP: 130/72 (!) 120/58  Pulse: 89 90  Resp: 20 18  Temp: (!) 97.5 F (36.4 C) 98.1 F (36.7 C)  SpO2: 98% 94%    Intake/Output Summary (Last 24 hours) at 05/26/2021 1621 Last data filed at 05/26/2021 1220 Gross per 24 hour  Intake --  Output 1400 ml  Net -1400 ml    Filed Weights   05/24/21 1034  Weight: 71.7 kg   Body mass index is 31.91 kg/m.  Exam:  General: Alert and oriented x3, no acute distress HEENT: Normocephalic and atraumatic, mucous membranes are slightly dry Cardiovascular: Regular rate and rhythm, S1-S2 Respiratory: Clear to auscultation bilaterally Abdomen: Soft, nontender, nondistended, positive bowel sounds Musculoskeletal: No clubbing or cyanosis, trace pitting edema Skin: No skin breaks, tears or lesions Psychiatry: Appropriate, no evidence of psychoses Neurology: Nonspecific weakness  Data Reviewed: BNP of 123  Disposition:  Status is: Inpatient Remains inpatient appropriate because: Disposition to be determined, potential CIR candidate      Family Communication: Daughter and son at the bedside DVT Prophylaxis:   SCDs    Author: Annita Brod ,MD 05/26/2021 4:21 PM  To reach On-call, see care teams to locate the attending and reach out via [email or web address]. Between 7PM-7AM, please contact night-coverage If you still have difficulty reaching the attending provider, please page the [facility] (Director on Call) for [facility] on amion for assistance.

## 2021-05-27 LAB — BASIC METABOLIC PANEL
Anion gap: 9 (ref 5–15)
BUN: 15 mg/dL (ref 8–23)
CO2: 26 mmol/L (ref 22–32)
Calcium: 9 mg/dL (ref 8.9–10.3)
Chloride: 102 mmol/L (ref 98–111)
Creatinine, Ser: 0.88 mg/dL (ref 0.44–1.00)
GFR, Estimated: >60 mL/min (ref >60)
Glucose, Bld: 101 mg/dL — ABNORMAL HIGH (ref 70–99)
Potassium: 3.5 mmol/L (ref 3.5–5.1)
Sodium: 137 mmol/L (ref 135–145)

## 2021-05-27 NOTE — Plan of Care (Signed)
Pt is alert oriented x 4. Pt ambulated in hallway with walker before bed. Pt HR increased to 110's. Denies pain but did verbalized she was tired from the walk. Pt assisted back to bed. Purewick replaced per pt request. No distress noted.   Problem: Education: Goal: Knowledge of General Education information will improve Description: Including pain rating scale, medication(s)/side effects and non-pharmacologic comfort measures Outcome: Progressing   Problem: Health Behavior/Discharge Planning: Goal: Ability to manage health-related needs will improve Outcome: Progressing   Problem: Clinical Measurements: Goal: Ability to maintain clinical measurements within normal limits will improve Outcome: Progressing Goal: Will remain free from infection Outcome: Progressing Goal: Diagnostic test results will improve Outcome: Progressing Goal: Respiratory complications will improve Outcome: Progressing Goal: Cardiovascular complication will be avoided Outcome: Progressing   Problem: Activity: Goal: Risk for activity intolerance will decrease Outcome: Progressing   Problem: Nutrition: Goal: Adequate nutrition will be maintained Outcome: Progressing   Problem: Coping: Goal: Level of anxiety will decrease Outcome: Progressing   Problem: Elimination: Goal: Will not experience complications related to bowel motility Outcome: Progressing Goal: Will not experience complications related to urinary retention Outcome: Progressing   Problem: Pain Managment: Goal: General experience of comfort will improve Outcome: Progressing   Problem: Safety: Goal: Ability to remain free from injury will improve Outcome: Progressing   Problem: Skin Integrity: Goal: Risk for impaired skin integrity will decrease Outcome: Progressing

## 2021-05-27 NOTE — PMR Pre-admission (Incomplete)
PMR Admission Coordinator Pre-Admission Assessment  Patient: Carmen Hammond is an 88 y.o., female MRN: [id number] DOB: Dec 04, 1928 Height: 4\' 11"  (149.9 cm) Weight: 71.7 kg  Insurance Information HMO: ***    PPO: ***     PCP:      IPA:      80/20:      OTHER:  PRIMARY: [employer] Medicare      Policy#: [id number]      Subscriber: patient CM Name: ***      Phone#: ***     Fax#: *** Pre-Cert#: ***      Employer: *** Benefits:  Phone #: ***     Name: *** Irene Shipper. Date: ***     Deduct: ***      Out of Pocket Max: ***      Life Max: *** CIR: ***      SNF: *** Outpatient: ***     Co-Pay: *** Home Health: ***      Co-Pay: *** DME: ***     Co-Pay: *** Providers: in-network SECONDARY:       Policy#:      Phone#:   Financial Counselor:       Phone#:   The [employer] for patients in Inpatient Rehabilitation Facilities with attached Privacy Act [place] Records was provided and verbally reviewed with: {CHL IP Patient Family NT:[id number]}  Emergency Contact Information Contact Information     Name Relation Home Work Mobile   [place] Son [phone]  [phone]   Fatema, Rabe [phone]   [place] Alt Daughter   [phone]   Jhade, Berko   [phone]       Current Medical History  Patient Admitting Diagnosis: stroke History of Present Illness: Pt is 88 year old female with medical hx significant for: HTN, bilateral THA, lumbar fusion, arthritis, hypothyroidism, atrial tachycardia and PSVT s/p cardiac ablation.  Pt presented to [facility] on 05/24/21 d/t confusion and fatigue.  CT head revealed acute/subacute right MCA stroke.  MRI confirmed right MCA territory infarct. CTA head and neck showed focal severe stenosis of the proximal superior M2 branch of the right MCA. Pt transferred to [facility] for further stroke work-up. Therapy evaluations completed and CIR recommended d/t pt's deficits in functional  mobility and ability to complete ADLs independently.  Complete NIHSS TOTAL: 0  Patient's medical record from [facility] has been reviewed by the rehabilitation admission coordinator and physician.  Past Medical History  Past Medical History:  Diagnosis Date   Arthritis    Dental crowns present    Dysrhythmia 2012   cardiac ablation   Hypertension    Hypothyroidism    Wears glasses     Has the patient had major surgery during 100 days prior to admission? No  Family History   family history is not on file.  Current Medications  Current Facility-Administered Medications:    acetaminophen (TYLENOL) tablet 650 mg, 650 mg, Oral, Q4H PRN, 650 mg at 05/25/21 2034 **OR** acetaminophen (TYLENOL) 160 MG/5ML solution 650 mg, 650 mg, Per Tube, Q4H PRN **OR** acetaminophen (TYLENOL) suppository 650 mg, 650 mg, Rectal, Q4H PRN, Carmen Hammond, Kshitiz, MD   aspirin suppository 300 mg, 300 mg, Rectal, Daily **OR** aspirin tablet 325 mg, 325 mg, Oral, Daily, Carmen Hammond, Kshitiz, MD, 325 mg at 05/27/21 1002   atorvastatin (LIPITOR) tablet 20 mg, 20 mg, Oral, QHS, Rosalin Hawking, MD, 20 mg at 05/26/21 2022   clopidogrel (PLAVIX) tablet 75 mg, 75 mg, Oral, Daily, Amie Portland, MD,  75 mg at 05/27/21 1002   levothyroxine (SYNTHROID) tablet 50 mcg, 50 mcg, Oral, QAC breakfast, Starla Link, Kshitiz, MD, 50 mcg at 05/27/21 0648   senna-docusate (Senokot-S) tablet 1 tablet, 1 tablet, Oral, QHS PRN, Aline August, MD  Patients Current Diet:  Diet Order             Diet Heart Room service appropriate? No; Fluid consistency: Thin  Diet effective now                   Precautions / Restrictions Precautions Precautions: Fall   Has the patient had 2 or more falls or a fall with injury in the past year? Yes  Prior Activity Level Community (5-7x/wk): drives, gets out of house 3-4x/week  Prior Functional Level Self Care: Did the patient need help bathing, dressing, using the toilet or eating?  Independent  Indoor Mobility: Did the patient need assistance with walking from room to room (with or without device)? Independent  Stairs: Did the patient need assistance with internal or external stairs (with or without device)? Independent  Functional Cognition: Did the patient need help planning regular tasks such as shopping or remembering to take medications? Independent  Patient Information Are you of Hispanic, Latino/a,or Spanish origin?: A. No, not of Hispanic, Latino/a, or Spanish origin What is your race?: A. White Do you need or want an interpreter to communicate with a doctor or health care staff?: 0. No  Patient's Response To:  Health Literacy and Transportation Is the patient able to respond to health literacy and transportation needs?: Yes Health Literacy - How often do you need to have someone help you when you read instructions, pamphlets, or other written material from your doctor or pharmacy?: Never In the past 12 months, has lack of transportation kept you from medical appointments or from getting medications?: No In the past 12 months, has lack of transportation kept you from meetings, work, or from getting things needed for daily living?: No  Development worker, international aid / [place] Devices/Equipment: None Home Equipment: Conservation officer, nature (2 wheels), [employer] - single point, BSC/3in1, Civil engineer, contracting  Prior Device Use: Indicate devices/aids used by the patient prior to current illness, exacerbation or injury? Walker and cane  Current Functional Level Cognition  Arousal/Alertness: Awake/alert Overall Cognitive Status: Impaired/Different from baseline Current Attention Level: Selective Orientation Level: Oriented X4 Safety/Judgement: Decreased awareness of safety, Decreased awareness of deficits General Comments: Scored a 0/28 on the Short Blessed Test, which is WNL. Slow processingnoted during tasks. Decreased aawareness of safely/deficits. [place] and was on floor  before she was found by her son - pt does not remember. Attention: Sustained Sustained Attention: Appears intact Memory: Impaired Memory Impairment: Storage deficit, Retrieval deficit, Other (comment) (recalled 3 of 5 words after 2 minute delay) Awareness: Impaired Awareness Impairment: Anticipatory impairment Problem Solving:  (intact at basic level) Safety/Judgment: Other (comment) Comments: patient slow processing and not demonstrating adequate awareness    Extremity Assessment (includes Sensation/Coordination)  Upper Extremity Assessment: LUE deficits/detail LUE Deficits / Details: mild weakness noted with "clumsiness" during tasks, but functional LUE Coordination: decreased fine motor  Lower Extremity Assessment: Defer to PT evaluation LLE Deficits / Details: mildly weak, but functional and not focal. LLE Sensation: WNL    ADLs  Overall ADL's : Needs assistance/impaired Eating/Feeding: Modified independent Grooming: Min guard, Standing Upper Body Bathing: Sitting Lower Body Bathing: Minimal assistance, Sit to/from stand Upper Body Dressing : Minimal assistance, Sitting Lower Body Dressing: Minimal assistance, Sit to/from stand Toilet  Transfer: Minimal assistance Toileting- Clothing Manipulation and Hygiene: Minimal assistance Functional mobility during ADLs: Minimal assistance    Mobility  Overal bed mobility: Needs Assistance Bed Mobility: Supine to Sit Supine to sit: Supervision (flat bed, need for rail without any warm up before moving) General bed mobility comments: no assist flat HOB, scooted well to EOB    Transfers  Overall transfer level: Needs assistance Equipment used: Straight cane Transfers: Sit to/from Stand Sit to Stand: Min assist General transfer comment: min stability assist from lowered bed.    Ambulation / Gait / Stairs / Wheelchair Mobility  Ambulation/Gait Ambulation/Gait assistance: Herbalist (Feet): 100 Feet (x2 with standing  rest to recover) Assistive device: Straight cane Gait Pattern/deviations: Step-through pattern General Gait Details: mild unsteadiness, with notable "gimpy" or antalgic L LE, but with functional heel/toe patterning. Gait velocity interpretation: <1.8 ft/sec, indicate of risk for recurrent falls    Posture / Balance Balance Overall balance assessment: Needs assistance Sitting-balance support: Feet supported, No upper extremity supported Sitting balance-Leahy Scale: Good Standing balance support: Single extremity supported, No upper extremity supported, During functional activity Standing balance-Leahy Scale: Fair Standardized Balance Assessment Standardized Balance Assessment : Berg Balance Test Berg Balance Test Sit to Stand: Able to stand using hands after several tries Standing Unsupported: Able to stand 2 minutes with supervision Sitting with Back Unsupported but Feet Supported on Floor or Stool: Able to sit safely and securely 2 minutes Stand to Sit: Controls descent by using hands Transfers: Able to transfer safely, definite need of hands From Standing, Reach Forward with Outstretched Arm: Can reach forward >12 cm safely (5") From Standing Position, Pick up Object from Floor: Able to pick up shoe, needs supervision From Standing Position, Turn to Look Behind Over each Shoulder: Looks behind one side only/other side shows less weight shift Turn 360 Degrees: Able to turn 360 degrees safely but slowly    Special needs/care consideration External Urinary Catheter    Previous Home Environment (from acute therapy documentation) Living Arrangements: Children  Lives With: Son (lives with 2 sons) Available Help at Discharge: Family, Available 24 hours/day Type of Home: House Home Layout: Two level, 1/2 bath on main level (family is making a space for pt to live on main level when comes home from hospital) Alternate Level Stairs-Rails: Right, Left Alternate Level Stairs-Number of Steps:  15 steps Home Access: Stairs to enter Entrance Stairs-Rails: None Entrance Stairs-Number of Steps: 2 Bathroom Shower/Tub: Multimedia programmer: Handicapped height Bathroom Accessibility: Yes How Accessible: Accessible via walker [place]: No Additional Comments: wears glasses  Discharge Living Setting Plans for Discharge Living Setting: Patient's home Type of Home at Discharge: House Discharge Home Layout: Two level, 1/2 bath on main level (family making a space for pt to live on main level when she gets home from hospital) Alternate Level Stairs-Rails: Right, Left Alternate Level Stairs-Number of Steps: 15 steps Discharge Home Access: Stairs to enter Entrance Stairs-Rails: None Entrance Stairs-Number of Steps: 2 Discharge Bathroom Shower/Tub: Walk-in shower Discharge Bathroom Toilet: Handicapped height Discharge Bathroom Accessibility: Yes How Accessible: Accessible via walker Does the patient have any problems obtaining your medications?: No  Social/Family/Support Systems Patient Roles: Parent Anticipated Caregiver: Rashunda Passon, Deland Pretty Loder-children Anticipated Caregiver's Contact Information: [phone] Caregiver Availability: 24/7 Discharge Plan Discussed with Primary Caregiver: Yes Is Caregiver In Agreement with Plan?: Yes Does Caregiver/Family have Issues with Lodging/Transportation while Pt is in Rehab?: No  Goals Patient/Family Goal for Rehab: Mod  I: PT/OT/ST Expected length of stay: 5-7 days Pt/Family Agrees to Admission and willing to participate: Yes Program Orientation Provided & Reviewed with Pt/Caregiver Including Roles  & Responsibilities: Yes  Decrease burden of Care through IP rehab admission: NA  Possible need for SNF placement upon discharge: Not anticipated  Patient Condition: {PATIENT'S CONDITION:22832}  Preadmission Screen Completed By:  Bethel Born, 05/27/2021 12:25  PM ______________________________________________________________________   Discussed status with Dr. Marland Kitchen on *** at *** and received approval for admission today.  Admission Coordinator:  Bethel Born, CCC-SLP, time ***/Date ***   Assessment/Plan: Diagnosis: Does the need for close, 24 hr/day Medical supervision in concert with the patient's rehab needs make it unreasonable for this patient to be served in a less intensive setting? {yes_no_potentially:3041433} Co-Morbidities requiring supervision/potential complications: *** Due to {due BZ:1696789}, does the patient require 24 hr/day rehab nursing? {yes_no_potentially:3041433} Does the patient require coordinated care of a physician, rehab nurse, PT, OT, and SLP to address physical and functional deficits in the context of the above medical diagnosis(es)? {yes_no_potentially:3041433} Addressing deficits in the following areas: {deficits:3041436} Can the patient actively participate in an intensive therapy program of at least 3 hrs of therapy 5 days a week? {yes_no_potentially:3041433} The potential for patient to make measurable gains while on inpatient rehab is {potential:3041437} Anticipated functional outcomes upon discharge from inpatient rehab: {functional outcomes:304600100} PT, {functional outcomes:304600100} OT, {functional outcomes:304600100} SLP Estimated rehab length of stay to reach the above functional goals is: *** Anticipated discharge destination: {anticipated dc setting:21604} 10. Overall Rehab/Functional Prognosis: {potential:3041437}   MD Signature: ***

## 2021-05-27 NOTE — Progress Notes (Signed)
[facility] Progress Note  Patient: Carmen Hammond    FBP:[id number]  DOA: 05/24/2021    Date of Service: the patient was seen and examined on 05/27/2021  Brief hospital course: And70 year old female with past medical history of hypertension hypothyroidism, but otherwise independent and in good health who presented to the emergency room on 2/23 with complaints of confusion x2 days along with slurred speech.  In the emergency room, CT scan noted acute versus subacute infarct in the MCA distribution and patient brought in for further evaluation of CVA.  MRI confirmed acute to early subacute infarct in the right insula and frontal operculum extending to corona radiata and additional punctate infarct in the right centrum semiovale.  CT angiogram note severe stenosis of the proximal M2 branch.  Assessment and Plan: Assessment and Plan: * Stroke [facility])- (present on admission) CT angiogram noting severe stenosis of prox M2 segment.  Still possibly could be embolic.  No signs of atrial fibrillation.  May end up needing event monitor.  PT recommending CIR, although patient physically appears to be improving rather quickly.  Physical therapy for reassessment: Home health versus SNF.   Acute metabolic encephalopathy- (present on admission) Secondary to CVA.  Resolved.  HTN (hypertension)- (present on admission) -Monitor blood pressure.  Allow for permissive hypertension  Chronic diastolic CHF (congestive heart failure) ([place])- (present on admission) Grade 2 diastolic dysfunction noted on echocardiogram.  BNP with minimal elevation, given 1 dose of Lasix  Hypothyroidism- (present on admission) Continue synthroid.  Obesity (BMI 30-39.9)- (present on admission) Meets criteria with BMI greater than 30.   Body mass index is 31.91 kg/m.        Consultants: Neurology  Procedures: Echocardiogram: Grade 2 diastolic dysfunction  Antimicrobials: None  Code Status: Full code   Subjective:  Patient feeling better, no complaint  Objective: Mildly elevated blood pressure Vitals:   05/27/21 0740 05/27/21 1128  BP: 123/66 122/71  Pulse: 85 82  Resp: 20 20  Temp: 98.3 F (36.8 C) 97.9 F (36.6 C)  SpO2: 96% 96%    Intake/Output Summary (Last 24 hours) at 05/27/2021 1406 Last data filed at 05/27/2021 0700 Gross per 24 hour  Intake 180 ml  Output 1500 ml  Net -1320 ml    Filed Weights   05/24/21 1034  Weight: 71.7 kg   Body mass index is 31.91 kg/m.  Exam:  General: Alert and oriented x3, no acute distress HEENT: Normocephalic and atraumatic, mucous membranes are moist Cardiovascular: Regular rate and rhythm, S1-S2 Respiratory: Clear to auscultation bilaterally Abdomen: Soft, nontender, nondistended, positive bowel sounds Musculoskeletal: No clubbing or cyanosis, trace pitting edema Skin: No skin breaks, tears or lesions Psychiatry: Appropriate, no evidence of psychoses Neurology: Nonspecific weakness  Data Reviewed: Normal basic metabolic panel today  Disposition:  Status is: Inpatient Remains inpatient appropriate because: Disposition to be reassessed: Skilled nursing versus home health     Family Communication: Son at the bedside DVT Prophylaxis:   SCDs    Author: Annita Brod ,MD 05/27/2021 2:06 PM  To reach On-call, see care teams to locate the attending and reach out via [email or web address]. Between 7PM-7AM, please contact night-coverage If you still have difficulty reaching the attending provider, please page the [facility] (Director on Call) for [facility] on amion for assistance.

## 2021-05-28 MED ORDER — MAGNESIUM 30 MG PO TABS: 30.0000 mg | ORAL_TABLET | Freq: Two times a day (BID) | ORAL | Status: DC

## 2021-05-28 MED ORDER — ASPIRIN 325 MG PO TABS: 325.0000 mg | ORAL_TABLET | Freq: Every day | ORAL | 1 refills | Status: DC

## 2021-05-28 MED ORDER — CLOPIDOGREL BISULFATE 75 MG PO TABS: 75.0000 mg | ORAL_TABLET | Freq: Every day | ORAL | 0 refills | Status: DC

## 2021-05-28 MED ORDER — ATORVASTATIN CALCIUM 20 MG PO TABS: 20.0000 mg | ORAL_TABLET | Freq: Every day | ORAL | 2 refills | Status: AC

## 2021-05-28 NOTE — Plan of Care (Signed)
Her kids have been at the bedside. Pt has been sitting in the chair for meals. Hair has been shampooed with the cap.  Problem: Education: Goal: Knowledge of General Education information will improve Description: Including pain rating scale, medication(s)/side effects and non-pharmacologic comfort measures Outcome: Progressing   Problem: Health Behavior/Discharge Planning: Goal: Ability to manage health-related needs will improve Outcome: Progressing   Problem: Clinical Measurements: Goal: Ability to maintain clinical measurements within normal limits will improve Outcome: Progressing Goal: Will remain free from infection Outcome: Progressing Goal: Diagnostic test results will improve Outcome: Progressing Goal: Respiratory complications will improve Outcome: Progressing Goal: Cardiovascular complication will be avoided Outcome: Progressing   Problem: Activity: Goal: Risk for activity intolerance will decrease Outcome: Progressing   Problem: Nutrition: Goal: Adequate nutrition will be maintained Outcome: Progressing   Problem: Coping: Goal: Level of anxiety will decrease Outcome: Progressing   Problem: Elimination: Goal: Will not experience complications related to bowel motility Outcome: Progressing Goal: Will not experience complications related to urinary retention Outcome: Progressing   Problem: Pain Managment: Goal: General experience of comfort will improve Outcome: Progressing   Problem: Safety: Goal: Ability to remain free from injury will improve Outcome: Progressing   Problem: Skin Integrity: Goal: Risk for impaired skin integrity will decrease Outcome: Progressing

## 2021-05-28 NOTE — Plan of Care (Signed)
Pt is alert oriented x 4. Pt has ambulated in hallway with walker, and ambulated to restroom before bed. Pt denies pain. NIH 0. Pt resting eyes closed. No distress noted.     Problem: Education: Goal: Knowledge of General Education information will improve Description: Including pain rating scale, medication(s)/side effects and non-pharmacologic comfort measures Outcome: Progressing   Problem: Health Behavior/Discharge Planning: Goal: Ability to manage health-related needs will improve Outcome: Progressing   Problem: Clinical Measurements: Goal: Ability to maintain clinical measurements within normal limits will improve Outcome: Progressing Goal: Will remain free from infection Outcome: Progressing Goal: Diagnostic test results will improve Outcome: Progressing Goal: Respiratory complications will improve Outcome: Progressing Goal: Cardiovascular complication will be avoided Outcome: Progressing   Problem: Activity: Goal: Risk for activity intolerance will decrease Outcome: Progressing   Problem: Nutrition: Goal: Adequate nutrition will be maintained Outcome: Progressing   Problem: Coping: Goal: Level of anxiety will decrease Outcome: Progressing   Problem: Elimination: Goal: Will not experience complications related to bowel motility Outcome: Progressing Goal: Will not experience complications related to urinary retention Outcome: Progressing   Problem: Pain Managment: Goal: General experience of comfort will improve Outcome: Progressing   Problem: Safety: Goal: Ability to remain free from injury will improve Outcome: Progressing   Problem: Skin Integrity: Goal: Risk for impaired skin integrity will decrease Outcome: Progressing

## 2021-05-28 NOTE — Progress Notes (Signed)
OT impression: Pt is making excellent progress, d/c plan updated to home with support of family and OP OT to address limitations listed below. Pt completed toileting, grooming and LB dressing with supervision overall. Pt is slow and deliberate with movement but demonstrated good safety awareness and management of RW with supervision only. Pt will benefit from continued OT acutely.    05/28/21 0800  OT Visit Information  Last OT Received On 05/28/21  Assistance Needed +1  History of Present Illness pt is a 88 y/o female admitted 2/23 with confusion, slurred speech and generally off from her baseline.  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  Another punctate infarct in the right centrum semiovale.  No hemorrhagic transformation.  PMHx: bil THA, HTN, arthritis, lumbar fusion.  Precautions  Precautions Fall  Restrictions  Weight Bearing Restrictions No  Pain Assessment  Pain Assessment No/denies pain  Pain Intervention(s) Monitored during session  Cognition  Arousal/Alertness Awake/alert  Behavior During Therapy WFL for tasks assessed/performed  Overall Cognitive Status Within Functional Limits for tasks assessed  General Comments cog was [facility] for functional tasks asssessed this session, slow processing noted. Able to accurately carry out a 3 step ADL task  Upper Extremity Assessment  Upper Extremity Assessment Overall WFL for tasks assessed  LUE Deficits / Details WFL this session for all toileting, dressing and grooming tasks  Lower Extremity Assessment  Lower Extremity Assessment Defer to PT evaluation;RLE deficits/detail  RLE Deficits / Details Pt stated that her R leg felt "heavy" this date, moving functionally with RW  Vision- Assessment  Vision Assessment? No apparent visual deficits  Praxis  Praxis Not tested  ADL  Overall ADL's  Needs assistance/impaired  Grooming Wash/dry hands;Wash/dry  face;Brushing hair;Supervision/safety;Standing  Grooming Details (indicate cue type and reason) at the sink, no UE supported - no rest break required  Lower Body Dressing Supervision/safety;Set up;Sit to/from stand  Lower Body Dressing Details (indicate cue type and reason) changed mesh underwear and pad in the bathroom, use of grab bar for sit<>stand  Toilet Transfer Supervision/safety;Ambulation;Rolling walker (2 wheels)  Toilet Transfer Details (indicate cue type and reason) good safety awareness, more effort to move RLE  Toileting- Clothing Manipulation and Hygiene Modified independent;Sitting/lateral lean  Functional mobility during ADLs Supervision/safety;Rolling walker (2 wheels)  General ADL Comments pt moves slowly and deliberately with good safety awarness, states that her RLE is "heavy" this session but moving functionally with RW  Bed Mobility  Overal bed mobility Modified Independent  General bed mobility comments bed flat, no use of rails. mildly incr time  Transfers  Overall transfer level Needs assistance  Equipment used Rolling walker (2 wheels)  Transfers Sit to/from Stand  Sit to Stand Supervision  Balance  Overall balance assessment Needs assistance  Sitting-balance support Feet supported  Sitting balance-Leahy Scale Good  Standing balance support During functional activity;No upper extremity supported  Standing balance-Leahy Scale Fair  General Comments  General comments (skin integrity, edema, etc.) VSS on RA  OT - End of Session  Equipment Utilized During Treatment Gait belt;Rolling walker (2 wheels)  Activity Tolerance Patient tolerated treatment well  Patient left in chair;with call bell/phone within reach  Nurse Communication Mobility status  OT Assessment/Plan  OT Plan Discharge plan needs to be updated  OT Visit Diagnosis Unsteadiness on feet (R26.81);Other abnormalities of gait and mobility (R26.89);History of falling (Z91.81);Low vision, both eyes  (H54.2);Other symptoms and signs involving cognitive  function  OT Frequency (ACUTE ONLY) Min 2X/week  Follow Up Recommendations Outpatient OT  Assistance recommended at discharge Intermittent Supervision/Assistance  Patient can return home with the following A little help with walking and/or transfers;A little help with bathing/dressing/bathroom;Assistance with cooking/housework;Direct supervision/assist for medications management;Direct supervision/assist for financial management;Assist for transportation;Help with stairs or ramp for entrance  OT Equipment Tub/shower seat  AM-PAC OT "6 Clicks" Daily Activity Outcome Measure (Version 2)  Help from another person eating meals? 4  Help from another person taking care of personal grooming? 3  Help from another person toileting, which includes using toliet, bedpan, or urinal? 3  Help from another person bathing (including washing, rinsing, drying)? 3  Help from another person to put on and taking off regular upper body clothing? 4  Help from another person to put on and taking off regular lower body clothing? 3  6 Click Score 20  Progressive Mobility  What is the highest level of mobility based on the progressive mobility assessment? Level 5 (Walks with assist in room/hall) - Balance while stepping forward/back and can walk in room with assist - Complete  OT Goal Progression  Progress towards OT goals Progressing toward goals  Acute Rehab OT Goals  Patient Stated Goal home  OT Goal Formulation With patient/family  Time For Goal Achievement 06/08/21  Potential to Achieve Goals Good  ADL Goals  Pt Will Perform Upper Body Bathing with modified independence  Pt Will Perform Lower Body Bathing with modified independence  Pt Will Perform Upper Body Dressing with modified independence  Pt Will Perform Lower Body Dressing with modified independence  Pt Will Transfer to Toilet with modified independence;ambulating  Pt Will Perform Toileting -  Clothing Manipulation and hygiene with modified independence;sit to/from stand  OT Time Calculation  OT Start Time (ACUTE ONLY) 5035  OT Stop Time (ACUTE ONLY) 0835  OT Time Calculation (min) 21 min  OT General Charges  $OT Visit 1 Visit  OT Treatments  $Self Care/Home Management  8-22 mins  Perception  Perception Not tested

## 2021-05-28 NOTE — Care Management Important Message (Signed)
Important Message  Patient Details  Name: Carmen Hammond MRN: [id number] Date of Birth: [birth year]   Medicare Important Message Given:  Yes     [name] Montine Circle 05/28/2021, 2:43 PM

## 2021-05-28 NOTE — Progress Notes (Signed)
Inpatient Rehab Admissions Coordinator:  Saw pt, son Glendell Docker and daughter, Gordy Clement at bedside. PT/OT have updated d/c recommendations to outpatient/HH therapy. Pt is currently able to ambulate 350' with supervision. Pt is too functional for CIR. TOC made aware. AC will sign off.   Gayland Curry, [place], [place] Admissions Coordinator [phone]

## 2021-05-28 NOTE — Discharge Summary (Signed)
Physician Discharge Summary   Patient: Carmen Hammond MRN: [id number] DOB: Aug 01, 1928  Admit date:     05/24/2021  Discharge date: 05/28/21  Discharge Physician: Annita Brod   PCP: Chesley Noon, MD   Recommendations at discharge:    New Medication: -Aspirin 325 mg p.o. daily New medication: Plavix 75 mg p.o. daily x3 months New medication: Lipitor 20 mg p.o. daily Patient will be discharged home health PT, OT and RN  Discharge Diagnoses: Principal Problem:   Stroke [facility]) Active Problems:   Acute metabolic encephalopathy   HTN (hypertension)   Chronic diastolic CHF (congestive heart failure) (HCC)   Hypothyroidism   Obesity (BMI 30-39.9)  Resolved Problems:   * No resolved hospital problems. [facility] Course: And88 year old female with past medical history of hypertension hypothyroidism, but otherwise independent and in good health who presented to the emergency room on 2/23 with complaints of confusion x2 days along with slurred speech.  In the emergency room, CT scan noted acute versus subacute infarct in the MCA distribution and patient brought in for further evaluation of CVA.  MRI confirmed acute to early subacute infarct in the right insula and frontal operculum extending to corona radiata and additional punctate infarct in the right centrum semiovale.  CT angiogram note severe stenosis of the proximal M2 branch.  Assessment and Plan: * Stroke [facility])- (present on admission) LeftCT angiogram noting severe stenosis of prox M2 segment.  Still possibly could be embolic.  No signs of atrial fibrillation.  May end up needing event monitor.  PT recommending CIR, although patient physically appears to be improving rather quickly and after physical therapy reassessment, patient will go home with home health PT. she had previously been on 81 mg aspirin daily, but this had been stopped a few months back prior to her CVA.  Being restarted 325 p.o. daily.  She also be on  Plavix 75 mg p.o. daily x3 months.  Statin at 20 mg daily.  Acute metabolic encephalopathy- (present on admission) Secondary to CVA.  Resolved.  HTN (hypertension)- (present on admission) -Monitor blood pressure.  Allow for permissive hypertension  Chronic diastolic CHF (congestive heart failure) ([place])- (present on admission) Grade 2 diastolic dysfunction noted on echocardiogram.  BNP with minimal elevation, given 1 dose of Lasix  Hypothyroidism- (present on admission) Continue synthroid.  Obesity (BMI 30-39.9)- (present on admission) Meets criteria with BMI greater than 30.        Consultants: Neurology Procedures: Echocardiogram-grade 1 diastolic dysfunction Disposition: Home Diet recommendation:  Discharge Diet Orders (From admission, onward)     Start     Ordered   05/28/21 0000  Diet - low sodium heart healthy        05/28/21 1432           Cardiac diet  DISCHARGE MEDICATION: Allergies as of 05/28/2021       Reactions   Meperidine Hcl Nausea And Vomiting   Nitrofurantoin Nausea And Vomiting   Sulfa Antibiotics Nausea And Vomiting        Medication List     TAKE these medications    aspirin 325 MG tablet Take 1 tablet (325 mg total) by mouth daily. Start taking on: May 29, 2021   atorvastatin 20 MG tablet Commonly known as: LIPITOR Take 1 tablet (20 mg total) by mouth at bedtime.   BIOTIN PO Take 1 tablet by mouth in the morning.   cholecalciferol 25 MCG (1000 UNIT) tablet Commonly known as: VITAMIN D3 Take  1,000 Units by mouth in the morning.   clopidogrel 75 MG tablet Commonly known as: PLAVIX Take 1 tablet (75 mg total) by mouth daily. Start taking on: May 29, 2021   diltiazem 120 MG 24 hr capsule Commonly known as: CARDIZEM CD Take 120 mg by mouth every evening.   levothyroxine 25 MCG tablet Commonly known as: SYNTHROID Take 25 mcg by mouth every evening.   magnesium 30 MG tablet Take 1 tablet (30 mg total) by mouth  2 (two) times daily. What changed: how much to take   metoprolol tartrate 50 MG tablet Commonly known as: LOPRESSOR Take 50 mg by mouth 2 (two) times daily.   Multivitamin Adults 50+ Tabs Take 1 tablet by mouth in the morning.   traMADol 50 MG tablet Commonly known as: Ultram Take 1 tablet (50 mg total) by mouth every 6 (six) hours as needed. What changed: reasons to take this        Follow-up Information     [facility]. Schedule an appointment as soon as possible for a visit in 1 month(s).   Specialty: Neurology Why: stroke clinic Contact information: [place]. Schedule an appointment as soon as possible for a visit.   Specialty: Rehabilitation Why: When ready to begin outpatient therapy, please contact the [facility] [place] office to schedule appointments. Contact information: [address] [place] [id number] [place] [address] [place] Follow up.   Why: Representative from [facility] will contact you to schedule start of home health services.        [facility] Follow up.   Why: Cardiology will call you to set up a 30-day event monitor. Contact information: [place] [address] [phone]                Discharge Exam: Danley Danker Weights   05/24/21 1034  Weight: 71.7 kg   General: Alert and oriented x3, no acute distress Cardiovascular: Regular rate rhythm, S1-S2 Lungs: Clear to auscultation bilaterally  Condition at discharge: good  The results of significant diagnostics from this hospitalization (including imaging, microbiology, ancillary and laboratory) are listed below for reference.   Imaging Studies: CT Angio Head W or Wo Contrast  Result Date:  05/24/2021 CLINICAL DATA:  Neuro deficit, right MCA stroke seen on CT. EXAM: CT ANGIOGRAPHY HEAD AND NECK TECHNIQUE: Multidetector CT imaging of the head and neck was performed using the standard protocol during bolus administration of intravenous contrast. Multiplanar CT image reconstructions and MIPs were obtained to evaluate the vascular anatomy. Carotid stenosis measurements (when applicable) are obtained utilizing NASCET criteria, using the distal internal carotid diameter as the denominator. RADIATION DOSE REDUCTION: This exam was performed according to the departmental dose-optimization program which includes automated exposure control, adjustment of the mA and/or kV according to patient size and/or use of iterative reconstruction technique. CONTRAST:  65mL OMNIPAQUE IOHEXOL 350 MG/ML SOLN COMPARISON:  Same-day noncontrast head CT FINDINGS: CTA NECK FINDINGS Aortic arch: There is mild calcified atherosclerotic plaque of the aortic arch. The origins of the major branch vessels are patent. The subclavian arteries are patent. The main pulmonary artery is dilated measuring up to 3.5 cm. Right carotid system: The right common carotid artery is tortuous with a retropharyngeal course common but patent. There is mild calcified plaque  at the bifurcation without significant stenosis or occlusion. There is a retropharyngeal course of the right ICA. There is no dissection or aneurysm. Left carotid system: The left common, internal, and external carotid arteries are patent, without hemodynamically significant stenosis or occlusion. There is no dissection or aneurysm. Vertebral arteries: The left vertebral artery is dominant, a normal variant. There is no significant stenosis or occlusion. There is no dissection or aneurysm. Skeleton: There is multilevel degenerative change of the cervical spine with grade 1 anterolisthesis of C3 on C4. There is no visible canal hematoma. There is no acute osseous abnormality or aggressive  osseous lesion. Other neck: The soft tissues are unremarkable. Upper chest: Imaged lung apices are clear. Review of the MIP images confirms the above findings CTA HEAD FINDINGS Anterior circulation: There is mild calcification of the intracranial ICAs without hemodynamically significant stenosis. The right M1 segment is patent. There is focal severe stenosis of a proximal superior M2 division (8-77), with distal reconstitution. The remainder of the M2 branches are patent. The left MCA and distal branches are patent. The bilateral ACAs are patent. The anterior communicating artery is normal. There is no aneurysm or AVM. Posterior circulation: There is a PICA termination of the right vertebral artery, a normal variant. The dominant left vertebral artery is patent. PICA is identified on the left. The basilar artery is patent. Bilateral PCAs are patent. A small right posterior communicating artery is noted. Venous sinuses: Patent. Anatomic variants: Fall persistent trigeminal artery is noted. Review of the MIP images confirms the above findings IMPRESSION: 1. Focal severe stenosis of a proximal right superior M2 branch with distal reconstitution, corresponding to the infarct seen on prior CT. 2. Otherwise, patent vasculature of the head and neck. 3. Dilated main pulmonary artery measuring up to 3.5 cm suggesting pulmonary hypertension. Electronically Signed   By: Valetta Mole M.D.   On: 05/24/2021 16:53   CT Head Wo Contrast  Result Date: 05/24/2021 CLINICAL DATA:  Altered mental status, lethargy starting at 9 p.m. last night EXAM: CT HEAD WITHOUT CONTRAST TECHNIQUE: Contiguous axial images were obtained from the base of the skull through the vertex without intravenous contrast. RADIATION DOSE REDUCTION: This exam was performed according to the departmental dose-optimization program which includes automated exposure control, adjustment of the mA and/or kV according to patient size and/or use of iterative  reconstruction technique. COMPARISON:  None. FINDINGS: Brain: There is confluent hypodensity in the right frontal operculum consistent with acute to early subacute infarct. ASPECTS score 7. There is no evidence of hemorrhagic transformation. There is no other evidence of acute infarct. Background parenchymal volume is normal for age. There is no significant burden of white matter microangiopathic change by CT. There is no mass lesion.  There is no midline shift. Vascular: No definite dense vessel sign is seen. Skull: Normal. Negative for fracture or focal lesion. Sinuses/Orbits: The imaged paranasal sinuses are clear. Bilateral lens implants are in place. The globes and orbits are otherwise unremarkable. Other: Presumed vascular coils are seen in the left suboccipital soft tissues. IMPRESSION: 1. Acute to early subacute infarct in the right insula and frontal operculum in the MCA distribution. 2. ASPECTS score 7. These results were called by telephone at the time of interpretation on 05/24/2021 at 2:54 Pm to provider [facility] , who verbally acknowledged these results. Electronically Signed   By: Valetta Mole M.D.   On: 05/24/2021 15:04   CT ANGIO NECK W OR WO CONTRAST  Result Date: 05/24/2021 CLINICAL DATA:  Neuro deficit, right MCA stroke seen on CT. EXAM: CT ANGIOGRAPHY HEAD AND NECK TECHNIQUE: Multidetector CT imaging of the head and neck was performed using the standard protocol during bolus administration of intravenous contrast. Multiplanar CT image reconstructions and MIPs were obtained to evaluate the vascular anatomy. Carotid stenosis measurements (when applicable) are obtained utilizing NASCET criteria, using the distal internal carotid diameter as the denominator. RADIATION DOSE REDUCTION: This exam was performed according to the departmental dose-optimization program which includes automated exposure control, adjustment of the mA and/or kV according to patient size and/or use of iterative  reconstruction technique. CONTRAST:  42mL OMNIPAQUE IOHEXOL 350 MG/ML SOLN COMPARISON:  Same-day noncontrast head CT FINDINGS: CTA NECK FINDINGS Aortic arch: There is mild calcified atherosclerotic plaque of the aortic arch. The origins of the major branch vessels are patent. The subclavian arteries are patent. The main pulmonary artery is dilated measuring up to 3.5 cm. Right carotid system: The right common carotid artery is tortuous with a retropharyngeal course common but patent. There is mild calcified plaque at the bifurcation without significant stenosis or occlusion. There is a retropharyngeal course of the right ICA. There is no dissection or aneurysm. Left carotid system: The left common, internal, and external carotid arteries are patent, without hemodynamically significant stenosis or occlusion. There is no dissection or aneurysm. Vertebral arteries: The left vertebral artery is dominant, a normal variant. There is no significant stenosis or occlusion. There is no dissection or aneurysm. Skeleton: There is multilevel degenerative change of the cervical spine with grade 1 anterolisthesis of C3 on C4. There is no visible canal hematoma. There is no acute osseous abnormality or aggressive osseous lesion. Other neck: The soft tissues are unremarkable. Upper chest: Imaged lung apices are clear. Review of the MIP images confirms the above findings CTA HEAD FINDINGS Anterior circulation: There is mild calcification of the intracranial ICAs without hemodynamically significant stenosis. The right M1 segment is patent. There is focal severe stenosis of a proximal superior M2 division (8-77), with distal reconstitution. The remainder of the M2 branches are patent. The left MCA and distal branches are patent. The bilateral ACAs are patent. The anterior communicating artery is normal. There is no aneurysm or AVM. Posterior circulation: There is a PICA termination of the right vertebral artery, a normal variant. The  dominant left vertebral artery is patent. PICA is identified on the left. The basilar artery is patent. Bilateral PCAs are patent. A small right posterior communicating artery is noted. Venous sinuses: Patent. Anatomic variants: Fall persistent trigeminal artery is noted. Review of the MIP images confirms the above findings IMPRESSION: 1. Focal severe stenosis of a proximal right superior M2 branch with distal reconstitution, corresponding to the infarct seen on prior CT. 2. Otherwise, patent vasculature of the head and neck. 3. Dilated main pulmonary artery measuring up to 3.5 cm suggesting pulmonary hypertension. Electronically Signed   By: Valetta Mole M.D.   On: 05/24/2021 16:53   MR BRAIN WO CONTRAST  Result Date: 05/24/2021 CLINICAL DATA:  Stroke suspected, infarct seen on prior CT EXAM: MRI HEAD WITHOUT CONTRAST TECHNIQUE: Multiplanar, multiecho pulse sequences of the brain and surrounding structures were obtained without intravenous contrast. COMPARISON:  Same-day noncontrast head CT FINDINGS: Brain: There is diffusion restriction with associated T2/FLAIR signal abnormality in the right insula and frontal operculum extending to the corona radiata consistent with acute to early subacute infarct. There is an additional punctate infarct in the right centrum semiovale. There is no evidence of hemorrhagic transformation. Background  parenchymal volume is within normal limits for age. The ventricles are normal in size. Small foci of FLAIR signal abnormality in the subcortical and periventricular white matter likely reflects sequela of mild chronic white matter microangiopathy. There is a single punctate chronic microhemorrhage in the right frontal lobe, nonspecific. There is no mass lesion.  There is no midline shift. Vascular: Normal flow voids. Skull and upper cervical spine: Normal marrow signal. Sinuses/Orbits: The paranasal sinuses are clear. Bilateral lens implants are in place. The globes and orbits are  otherwise unremarkable. Other: None. IMPRESSION: Acute to early subacute infarct in the right insula and frontal operculum extending to the corona radiata in the MCA distribution, and additional punctate infarct in the right centrum semiovale. No hemorrhagic transformation. Electronically Signed   By: Valetta Mole M.D.   On: 05/24/2021 17:24   ECHOCARDIOGRAM COMPLETE  Result Date: 05/25/2021    ECHOCARDIOGRAM REPORT   Patient Name:   Carmen Hammond Date of Exam: 05/25/2021 Medical Rec #:  [id number]        Height:       59.0 in Accession #:    [id number]       Weight:       158.0 lb Date of Birth:  09/23/1928        BSA:          1.669 m Patient Age:    45 years         BP:           125/66 mmHg Patient Gender: F                HR:           98 bpm. Exam Location:  Outpatient Procedure: 2D Echo, Cardiac Doppler and Color Doppler Indications:    Stroke  History:        Patient has no prior history of Echocardiogram examinations.                 Risk Factors:Hypertension. S/p ablation.  Sonographer:    Clayton Lefort RDCS (AE) Referring Phys: [id number] [place]  1. Left ventricular ejection fraction, by estimation, is 60 to 65%. The left ventricle has normal function. The left ventricle has no regional wall motion abnormalities. There is mild left ventricular hypertrophy. Left ventricular diastolic parameters are consistent with Grade II diastolic dysfunction (pseudonormalization).  2. Right ventricular systolic function is normal. The right ventricular size is normal.  3. Left atrial size was severely dilated.  4. Right atrial size was moderately dilated.  5. The mitral valve is normal in structure. Trivial mitral valve regurgitation. No evidence of mitral stenosis.  6. The aortic valve was not well visualized. Aortic valve regurgitation is not visualized. Mild aortic valve stenosis. Aortic valve area, by VTI measures 1.63 cm. Aortic valve mean gradient measures 5.0 mmHg. Aortic valve Vmax measures  1.52 m/s.  7. The inferior vena cava is normal in size with greater than 50% respiratory variability, suggesting right atrial pressure of 3 mmHg. FINDINGS  Left Ventricle: Left ventricular ejection fraction, by estimation, is 60 to 65%. The left ventricle has normal function. The left ventricle has no regional wall motion abnormalities. The left ventricular internal cavity size was normal in size. There is  mild left ventricular hypertrophy. Left ventricular diastolic parameters are consistent with Grade II diastolic dysfunction (pseudonormalization). Right Ventricle: The right ventricular size is normal. No increase in right ventricular wall thickness. Right ventricular systolic function is normal. Left Atrium:  Left atrial size was severely dilated. Right Atrium: Right atrial size was moderately dilated. Pericardium: There is no evidence of pericardial effusion. Mitral Valve: The mitral valve is normal in structure. Trivial mitral valve regurgitation. No evidence of mitral valve stenosis. Tricuspid Valve: The tricuspid valve is not well visualized. Tricuspid valve regurgitation is trivial. No evidence of tricuspid stenosis. Aortic Valve: The aortic valve was not well visualized. Aortic valve regurgitation is not visualized. Mild aortic stenosis is present. Aortic valve mean gradient measures 5.0 mmHg. Aortic valve peak gradient measures 9.2 mmHg. Aortic valve area, by VTI measures 1.63 cm. Pulmonic Valve: The pulmonic valve was normal in structure. Pulmonic valve regurgitation is not visualized. No evidence of pulmonic stenosis. Aorta: The aortic root is normal in size and structure. Venous: The inferior vena cava is normal in size with greater than 50% respiratory variability, suggesting right atrial pressure of 3 mmHg. IAS/Shunts: No atrial level shunt detected by color flow Doppler.  LEFT VENTRICLE PLAX 2D LVIDd:         3.80 cm   Diastology LVIDs:         2.60 cm   LV e' medial:    7.50 cm/s LV PW:          1.40 cm   LV E/e' medial:  12.3 LV IVS:        1.30 cm   LV e' lateral:   12.20 cm/s LVOT diam:     1.80 cm   LV E/e' lateral: 7.6 LV SV:         42 LV SV Index:   25 LVOT Area:     2.54 cm  RIGHT VENTRICLE             IVC RV Basal diam:  2.90 cm     IVC diam: 1.00 cm RV S prime:     14.00 cm/s TAPSE (M-mode): 1.9 cm LEFT ATRIUM             Index        RIGHT ATRIUM           Index LA diam:        3.10 cm 1.86 cm/m   RA Area:     14.10 cm LA Vol (A2C):   77.7 ml 46.57 ml/m  RA Volume:   34.20 ml  20.50 ml/m LA Vol (A4C):   52.8 ml 31.64 ml/m LA Biplane Vol: 67.9 ml 40.69 ml/m  AORTIC VALVE AV Area (Vmax):    1.69 cm AV Area (Vmean):   1.59 cm AV Area (VTI):     1.63 cm AV Vmax:           152.00 cm/s AV Vmean:          107.000 cm/s AV VTI:            0.261 m AV Peak Grad:      9.2 mmHg AV Mean Grad:      5.0 mmHg LVOT Vmax:         101.00 cm/s LVOT Vmean:        66.900 cm/s LVOT VTI:          0.167 m LVOT/AV VTI ratio: 0.64  AORTA Ao Root diam: 3.30 cm Ao Asc diam:  3.60 cm MITRAL VALVE MV Area (PHT): 4.39 cm    SHUNTS MV Decel Time: 173 msec    Systemic VTI:  0.17 m MV E velocity: 92.50 cm/s  Systemic Diam: 1.80 cm MV A velocity: 65.50 cm/s  MV E/A ratio:  1.41 Glori Bickers MD Electronically signed by Glori Bickers MD Signature Date/Time: 05/25/2021/3:45:23 PM    Final     Microbiology: Results for orders placed or performed during the hospital encounter of 05/24/21  Resp Panel by RT-PCR (Flu A&B, Covid) Nasopharyngeal Swab     Status: None   Collection Time: 05/24/21 11:55 AM   Specimen: Nasopharyngeal Swab; Nasopharyngeal(NP) swabs in vial transport medium  Result Value Ref Range Status   SARS Coronavirus 2 by RT PCR NEGATIVE NEGATIVE Final    Comment: (NOTE) SARS-CoV-2 target nucleic acids are NOT DETECTED.  The SARS-CoV-2 RNA is generally detectable in upper respiratory specimens during the acute phase of infection. The lowest concentration of SARS-CoV-2 viral copies this assay can  detect is 138 copies/mL. A negative result does not preclude SARS-Cov-2 infection and should not be used as the sole basis for treatment or other patient management decisions. A negative result may occur with  improper specimen collection/handling, submission of specimen other than nasopharyngeal swab, presence of viral mutation(s) within the areas targeted by this assay, and inadequate number of viral copies(<138 copies/mL). A negative result must be combined with clinical observations, patient history, and epidemiological information. The expected result is Negative.  Fact Sheet for Patients:  EntrepreneurPulse.com.au  Fact Sheet for Healthcare Providers:  IncredibleEmployment.be  This test is no t yet approved or cleared by the [place] FDA and  has been authorized for detection and/or diagnosis of SARS-CoV-2 by FDA under an Emergency Use Authorization (EUA). This EUA will remain  in effect (meaning this test can be used) for the duration of the COVID-19 declaration under Section 564(b)(1) of the Act, 21 U.S.C.section 360bbb-3(b)(1), unless the authorization is terminated  or revoked sooner.       Influenza A by PCR NEGATIVE NEGATIVE Final   Influenza B by PCR NEGATIVE NEGATIVE Final    Comment: (NOTE) The Xpert Xpress SARS-CoV-2/FLU/RSV plus assay is intended as an aid in the diagnosis of influenza from Nasopharyngeal swab specimens and should not be used as a sole basis for treatment. Nasal washings and aspirates are unacceptable for Xpert Xpress SARS-CoV-2/FLU/RSV testing.  Fact Sheet for Patients: EntrepreneurPulse.com.au  Fact Sheet for Healthcare Providers: IncredibleEmployment.be  This test is not yet approved or cleared by the [place] FDA and has been authorized for detection and/or diagnosis of SARS-CoV-2 by FDA under an Emergency Use Authorization (EUA). This EUA will remain in  effect (meaning this test can be used) for the duration of the COVID-19 declaration under Section 564(b)(1) of the Act, 21 U.S.C. section 360bbb-3(b)(1), unless the authorization is terminated or revoked.  Performed at [facility] [place] [address], [place], [place] [address]   Urine Culture     Status: Abnormal   Collection Time: 05/24/21  3:50 PM   Specimen: Urine, Clean Catch  Result Value Ref Range Status   Specimen Description   Final    URINE, CLEAN CATCH Performed at [facility], [place] [address], [place], [place] [address]    Special Requests   Final    NONE Performed at [facility], [place] [address] [place], [place] [address]    Culture (A)  Final    <10,000 COLONIES/mL INSIGNIFICANT GROWTH Performed at [place] [address] [place], [place] [address]    Report Status 05/25/2021 FINAL  Final    Labs: CBC: Recent Labs  Lab 05/24/21 1155  WBC 9.2  NEUTROABS 7.4  HGB  12.4  HCT 37.6  MCV 93.3  PLT 165   Basic Metabolic Panel: Recent Labs  Lab 05/24/21 1155 05/27/21 0157  NA 136 137  K 3.7 3.5  CL 103 102  CO2 27 26  GLUCOSE 112* 101*  BUN 15 15  CREATININE 0.82 0.88  CALCIUM 9.1 9.0   Liver Function Tests: Recent Labs  Lab 05/24/21 1155  AST 25  ALT 20  ALKPHOS 47  BILITOT 0.6  PROT 7.3  ALBUMIN 3.9   CBG: No results for input(s): GLUCAP in the last 168 hours.  Discharge time spent: less than 30 minutes.  Signed: Annita Brod, MD [facility] 05/28/2021

## 2021-05-28 NOTE — TOC Transition Note (Signed)
Transition of Care [facility]) - CM/SW Discharge Note   Patient Details  Name: Carmen Hammond MRN: [id number] Date of Birth: 1928/07/10  [facility] of [facility]) CM/SW Contact:  Geralynn Ochs, LCSW Phone Number: 05/28/2021, 2:32 PM   Clinical Narrative:   CSW met with patient and daughter at bedside to discuss recommendation for outpatient therapy. Patient asking for home health instead as it will be difficult for a little while to get out of the house, and then she would like to pursue outpatient once she feels stronger. Patient has been active with [facility] in [facility] past, but is agreeable to any [facility] agency. CSW spoke with [facility], they are unable to accept. CSW spoke with [facility], they accepted referral. Referral also sent to neurorehab [facility] for when patient is ready to schedule. Family to provide transport home, no other needs identified. CSW signing off.    Final next level of care: [place] Barriers to Discharge: Barriers Resolved   Patient Goals and CMS Choice Patient states their goals for this hospitalization and ongoing recovery are:: to get back home CMS Medicare.gov Compare Post Acute Care list provided to:: Patient Choice offered to / list presented to : Patient  Discharge Placement                Patient to be transferred to facility by: Family Name of family member notified: Daughter at bedside Patient and family notified of of transfer: 05/28/21  Discharge Plan and Services                          HH Arranged: PT, OT [facility] Agency: [place] Date [place]: 05/28/21   Representative spoke with at [place]: Latricia Heft  Social Determinants of Health ([place]) Interventions     Readmission Risk Interventions No flowsheet data found.

## 2021-05-28 NOTE — Progress Notes (Signed)
Physical Therapy Treatment Patient Details Name: Carmen Hammond MRN: [id number] DOB: [birth year] Today's Date: 05/28/2021   History of Present Illness pt is a 88 y/o female admitted 2/23 with confusion, slurred speech and generally off from her baseline.  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  Another punctate infarct in the right centrum semiovale.  No hemorrhagic transformation.  PMHx: bil THA, HTN, arthritis, lumbar fusion.    PT Comments    Pt has met or exceeded all her goal.  Emphasis today on gait stability, safety with stair training, transfer safety and education on BE FAST with pt and family.   Recommendations for follow up therapy are one component of a multi-disciplinary discharge planning process, led by the attending physician.  Recommendations may be updated based on patient status, additional functional criteria and insurance authorization.  Follow Up Recommendations  Outpatient PT     Assistance Recommended at Discharge Intermittent Supervision/Assistance  Patient can return home with the following A little help with bathing/dressing/bathroom;Assistance with cooking/housework;Assist for transportation;Help with stairs or ramp for entrance   Equipment Recommendations  None recommended by PT    Recommendations for Other Services       Precautions / Restrictions Precautions Precautions: Fall Restrictions Weight Bearing Restrictions: No     Mobility  Bed Mobility               General bed mobility comments: OOB on arrival    Transfers   Equipment used: Rolling walker (2 wheels) Transfers: Sit to/from Stand Sit to Stand: Supervision           General transfer comment: discussion of safest technique.    Ambulation/Gait Ambulation/Gait assistance: Supervision Gait Distance (Feet): 350 Feet Assistive device: Rolling walker (2 wheels) Gait  Pattern/deviations: Step-through pattern   Gait velocity interpretation: 1.31 - 2.62 ft/sec, indicative of limited community ambulator   General Gait Details: With RW, there is almost no discernable pain/lim\p in the L LE.  Generally steady with light, appropriate use of the RW   Stairs Stairs: Yes           Wheelchair Mobility    Modified Rankin (Stroke Patients Only) Modified Rankin (Stroke Patients Only) Pre-Morbid Rankin Score: No symptoms Modified Rankin: Slight disability     Balance Overall balance assessment: Needs assistance Sitting-balance support: Feet supported Sitting balance-Leahy Scale: Good     Standing balance support: During functional activity, No upper extremity supported Standing balance-Leahy Scale: Fair                              Cognition Arousal/Alertness: Awake/alert Behavior During Therapy: WFL for tasks assessed/performed Overall Cognitive Status: Within Functional Limits for tasks assessed                                          Exercises      General Comments General comments (skin integrity, edema, etc.): vss      Pertinent Vitals/Pain Pain Assessment Pain Assessment: Faces Faces Pain Scale: No hurt Pain Intervention(s): Monitored during session    Home Living                          Prior Function  PT Goals (current goals can now be found in the care plan section) Acute Rehab PT Goals PT Goal Formulation: With patient Time For Goal Achievement: 06/08/21 Potential to Achieve Goals: Good Progress towards PT goals: Progressing toward goals    Frequency    Min 3X/week      PT Plan Current plan remains appropriate    Co-evaluation              AM-PAC PT "6 Clicks" Mobility   Outcome Measure  Help needed turning from your back to your side while in a flat bed without using bedrails?: A Little Help needed moving from lying on your back to sitting on  the side of a flat bed without using bedrails?: A Little Help needed moving to and from a bed to a chair (including a wheelchair)?: A Little Help needed standing up from a chair using your arms (e.g., wheelchair or bedside chair)?: A Little Help needed to walk in hospital room?: A Little Help needed climbing 3-5 steps with a railing? : A Little 6 Click Score: 18    End of Session   Activity Tolerance: Patient tolerated treatment well Patient left: in chair;with call bell/phone within reach;with family/visitor present Nurse Communication: Mobility status PT Visit Diagnosis: Other abnormalities of gait and mobility (R26.89)     Time: 9476-5465 PT Time Calculation (min) (ACUTE ONLY): 38 min  Charges:  $Gait Training: 8-22 mins $Therapeutic Activity: 8-22 mins $Self Care/Home Management: 8-22                     05/28/2021  Carmen Carne., PT Acute Rehabilitation Services [phone]  (pager) [phone]  (office)   Carmen Hammond 05/28/2021, 11:45 AM

## 2021-05-31 ENCOUNTER — Encounter: Payer: Self-pay | Admitting: *Deleted

## 2021-06-01 ENCOUNTER — Other Ambulatory Visit: Payer: Self-pay | Admitting: Physician Assistant

## 2021-06-01 DIAGNOSIS — I63511 Cerebral infarction due to unspecified occlusion or stenosis of right middle cerebral artery: Secondary | ICD-10-CM

## 2021-06-01 DIAGNOSIS — I4891 Unspecified atrial fibrillation: Secondary | ICD-10-CM

## 2021-07-04 ENCOUNTER — Ambulatory Visit: Payer: Medicare Other | Admitting: Cardiology

## 2021-07-19 ENCOUNTER — Inpatient Hospital Stay: Payer: Medicare Other | Admitting: Adult Health

## 2021-10-31 ENCOUNTER — Encounter (HOSPITAL_BASED_OUTPATIENT_CLINIC_OR_DEPARTMENT_OTHER): Payer: Self-pay | Admitting: Emergency Medicine

## 2021-10-31 ENCOUNTER — Emergency Department (HOSPITAL_BASED_OUTPATIENT_CLINIC_OR_DEPARTMENT_OTHER)
Admission: EM | Admit: 2021-10-31 | Discharge: 2021-10-31 | Disposition: A | Discharge status: Home / Self Care | Payer: Medicare Other | Attending: Emergency Medicine | Admitting: Emergency Medicine

## 2021-10-31 ENCOUNTER — Other Ambulatory Visit: Payer: Self-pay

## 2021-10-31 ENCOUNTER — Emergency Department (HOSPITAL_BASED_OUTPATIENT_CLINIC_OR_DEPARTMENT_OTHER): Payer: Medicare Other

## 2021-10-31 ENCOUNTER — Emergency Department (HOSPITAL_COMMUNITY): Payer: Medicare Other

## 2021-10-31 DIAGNOSIS — E039 Hypothyroidism, unspecified: Secondary | ICD-10-CM | POA: Insufficient documentation

## 2021-10-31 DIAGNOSIS — I639 Cerebral infarction, unspecified: Secondary | ICD-10-CM | POA: Diagnosis not present

## 2021-10-31 DIAGNOSIS — W19XXXA Unspecified fall, initial encounter: Secondary | ICD-10-CM | POA: Insufficient documentation

## 2021-10-31 DIAGNOSIS — R404 Transient alteration of awareness: Secondary | ICD-10-CM | POA: Insufficient documentation

## 2021-10-31 DIAGNOSIS — I10 Essential (primary) hypertension: Secondary | ICD-10-CM | POA: Insufficient documentation

## 2021-10-31 DIAGNOSIS — Z8673 Personal history of transient ischemic attack (TIA), and cerebral infarction without residual deficits: Secondary | ICD-10-CM | POA: Insufficient documentation

## 2021-10-31 LAB — URINALYSIS, ROUTINE W REFLEX MICROSCOPIC
Bilirubin Urine: NEGATIVE
Glucose, UA: NEGATIVE mg/dL
Hgb urine dipstick: NEGATIVE
Ketones, ur: NEGATIVE mg/dL
Leukocytes,Ua: NEGATIVE
Nitrite: NEGATIVE
Protein, ur: NEGATIVE mg/dL
Specific Gravity, Urine: <1.005 — ABNORMAL LOW (ref 1.005–1.030)
pH: 7.5 (ref 5.0–8.0)

## 2021-10-31 LAB — COMPREHENSIVE METABOLIC PANEL
ALT: 49 U/L — ABNORMAL HIGH (ref 0–44)
AST: 34 U/L (ref 15–41)
Albumin: 4.4 g/dL (ref 3.5–5.0)
Alkaline Phosphatase: 87 U/L (ref 38–126)
Anion gap: 11 (ref 5–15)
BUN: 11 mg/dL (ref 8–23)
CO2: 29 mmol/L (ref 22–32)
Calcium: 10 mg/dL (ref 8.9–10.3)
Chloride: 102 mmol/L (ref 98–111)
Creatinine, Ser: 0.85 mg/dL (ref 0.44–1.00)
GFR, Estimated: >60 mL/min (ref >60)
Glucose, Bld: 115 mg/dL — ABNORMAL HIGH (ref 70–99)
Potassium: 3.8 mmol/L (ref 3.5–5.1)
Sodium: 142 mmol/L (ref 135–145)
Total Bilirubin: 1 mg/dL (ref 0.3–1.2)
Total Protein: 7.2 g/dL (ref 6.5–8.1)

## 2021-10-31 LAB — DIFFERENTIAL
Abs Immature Granulocytes: 0.05 10*3/uL (ref 0.00–0.07)
Basophils Absolute: 0 10*3/uL (ref 0.0–0.1)
Basophils Relative: 1 %
Eosinophils Absolute: 0.1 10*3/uL (ref 0.0–0.5)
Eosinophils Relative: 1 %
Immature Granulocytes: 1 %
Lymphocytes Relative: 36 %
Lymphs Abs: 2.2 10*3/uL (ref 0.7–4.0)
Monocytes Absolute: 0.6 10*3/uL (ref 0.1–1.0)
Monocytes Relative: 9 %
Neutro Abs: 3.2 10*3/uL (ref 1.7–7.7)
Neutrophils Relative %: 52 %

## 2021-10-31 LAB — CBC
HCT: 41.1 % (ref 36.0–46.0)
Hemoglobin: 13.6 g/dL (ref 12.0–15.0)
MCH: 30.8 pg (ref 26.0–34.0)
MCHC: 33.1 g/dL (ref 30.0–36.0)
MCV: 93.2 fL (ref 80.0–100.0)
Platelets: 203 10*3/uL (ref 150–400)
RBC: 4.41 MIL/uL (ref 3.87–5.11)
RDW: 15.4 % (ref 11.5–15.5)
WBC: 6.1 10*3/uL (ref 4.0–10.5)
nRBC: 0 % (ref 0.0–0.2)

## 2021-10-31 LAB — APTT: aPTT: 26 seconds (ref 24–36)

## 2021-10-31 LAB — PROTIME-INR
INR: 1 (ref 0.8–1.2)
Prothrombin Time: 13.4 seconds (ref 11.4–15.2)

## 2021-10-31 LAB — ETHANOL: Alcohol, Ethyl (B): <10 mg/dL (ref <10)

## 2021-10-31 LAB — CBG MONITORING, ED: Glucose-Capillary: 117 mg/dL — ABNORMAL HIGH (ref 70–99)

## 2021-10-31 NOTE — ED Notes (Signed)
RT note: Blood Glucose level obtained, RN aware.

## 2021-10-31 NOTE — ED Provider Notes (Signed)
12:09 PM Patient seen on arrival to our facility after transfer from another.  Patient is awake, alert, airway patent.  I discussed her transport with EMS staff.  They note the patient exhibited some word salad on speech, had mild disorientation in transport, but was otherwise hemodynamically unremarkable.  MRI pending. 3:30 PM Patient awake, alert, smiling.  Family notes the patient is better than she was previously.  We discussed all findings including MRI results, patient is comfortable with following up with her physician as his family.    Carmin Muskrat, MD 10/31/21 1530

## 2021-10-31 NOTE — Discharge Instructions (Signed)
As discussed, it is very important that you follow-up with your physician for appropriate ongoing outpatient management of your episode today, your history, and your prior strokes.  This should include a discussion of your medications.  Return here for concerning changes in your condition.

## 2021-10-31 NOTE — ED Notes (Signed)
Patient Alert and oriented to baseline. Stable and ambulatory to baseline. Patient verbalized understanding of the discharge instructions.  Patient belongings were taken by the patient.   

## 2021-10-31 NOTE — ED Provider Notes (Signed)
Emergency Department Provider Note   I have reviewed the triage vital signs and the nursing notes.   HISTORY  Chief Complaint Fall   HPI Carmen Hammond is a 88 y.o. female with past history of prior stroke, hypertension presents emergency department for evaluation after being found on the floor this morning by her son.  She lives with her son who lives upstairs.  He last saw her normal at 10:30 PM yesterday.  He came downstairs to help get her up and out of bed and found her to be on her knees beside the bed.  She seemed more confused compared to her baseline.  He describes repetitive questioning and difficulty paying attention.  He states this feels very similar to when she had a stroke earlier this year.  He was able to get her into the car and drove here to the emergency department by private vehicle.  Patient denies any headache or other pain.  She does not recall falling or how she may have gotten on the floor.   Past Medical History:  Diagnosis Date   Arthritis    Dental crowns present    Dysrhythmia 2012   cardiac ablation   Hypertension    Hypothyroidism    Wears glasses     Review of Systems  Constitutional: No fever/chills. Positive confusion.  Eyes: No visual changes. ENT: No sore throat. Cardiovascular: Denies chest pain. Respiratory: Denies shortness of breath. Gastrointestinal: No abdominal pain.  No nausea, no vomiting.  No diarrhea.  No constipation. Genitourinary: Negative for dysuria. Musculoskeletal: Negative for back pain. Skin: Negative for rash. Neurological: Negative for headaches, focal weakness or numbness.   ____________________________________________   PHYSICAL EXAM:  VITAL SIGNS: ED Triage Vitals  Enc Vitals Group     BP 10/31/21 0919 (!) 163/90     Pulse Rate 10/31/21 0919 72     Resp 10/31/21 0919 18     Temp 10/31/21 0919 98 F (36.7 C)     Temp Source 10/31/21 0919 Oral     SpO2 10/31/21 0919 98 %     Weight 10/31/21 0915  149 lb (67.6 kg)     Height 10/31/21 0915 '4\' 11"'$  (1.499 m)    Constitutional: Alert. Oriented to person and place. Only able to correctly name the year currently. No distress.  Eyes: Conjunctivae are normal.  Head: Atraumatic. Nose: No congestion/rhinnorhea. Mouth/Throat: Mucous membranes are moist.  Neck: No stridor.  No cervical spine tenderness to palpation. Cardiovascular: Normal rate, regular rhythm. Good peripheral circulation. Grossly normal heart sounds.   Respiratory: Normal respiratory effort.  No retractions. Lungs CTAB. Gastrointestinal: Soft and nontender. No distention.  Musculoskeletal: No lower extremity tenderness nor edema. No gross deformities of extremities. Neurologic:  Normal speech and language. No gross focal neurologic deficits are appreciated.  5/5 strength in the bilateral upper and lower extremities with normal sensation.  No facial droop.  No pronator drift. Skin:  Skin is warm, dry and intact. No rash noted.   ____________________________________________   LABS (all labs ordered are listed, but only abnormal results are displayed)  Labs Reviewed  COMPREHENSIVE METABOLIC PANEL - Abnormal; Notable for the following components:      Result Value   Glucose, Bld 115 (*)    ALT 49 (*)    All other components within normal limits  URINALYSIS, ROUTINE W REFLEX MICROSCOPIC - Abnormal; Notable for the following components:   Color, Urine COLORLESS (*)    Specific Gravity, Urine <1.005 (*)  All other components within normal limits  CBG MONITORING, ED - Abnormal; Notable for the following components:   Glucose-Capillary 117 (*)    All other components within normal limits  PROTIME-INR  APTT  CBC  DIFFERENTIAL  ETHANOL   ____________________________________________  EKG   EKG Interpretation  Date/Time:  Wednesday October 31 2021 09:21:56 EDT Ventricular Rate:  73 PR Interval:    QRS Duration: 113 QT Interval:  412 QTC Calculation: 182 R  Axis:   4 Text Interpretation: Atrial fibrillation Incomplete left bundle branch block Low voltage, precordial leads Confirmed by Nanda Quinton [phone]) on 10/31/2021 9:32:31 AM        ____________________________________________  RADIOLOGY  MR BRAIN WO CONTRAST  Result Date: 10/31/2021 CLINICAL DATA:  Neuro deficit, acute, stroke suspected EXAM: MRI HEAD WITHOUT CONTRAST TECHNIQUE: Multiplanar, multiecho pulse sequences of the brain and surrounding structures were obtained without intravenous contrast. COMPARISON:  CT head from the same day.  MRI head May 24, 2021. FINDINGS: Brain: Multiple punctate foci of restricted diffusion in bilateral parietal white matter. Small area of restricted diffusion in the right parietal cortex.Remote right frontal infarct with encephalomalacia and gliosis and evidence of prior hemorrhage in this region. No evidence of acute hemorrhage, mass lesion, midline shift or hydrocephalus. Cerebral atrophy. Patchy T2/FLAIR hyperintensities in the white matter, nonspecific but compatible with chronic microvascular ischemic disease. Vascular: Major arterial flow voids are maintained at the skull base. Skull and upper cervical spine: Normal marrow signal. Sinuses/Orbits: Largely clear sinuses.  No acute orbital findings. Other: No mastoid effusions. IMPRESSION: 1. Multiple punctate acute subacute infarcts in bilateral parietal white matter. Small acute infarct in the right parietal cortex. No significant edema or mass effect. 2. Remote right frontal infarct. Electronically Signed   By: Margaretha Sheffield M.D.   On: 10/31/2021 14:45   CT HEAD WO CONTRAST  Result Date: 10/31/2021 CLINICAL DATA:  Mental status change, unknown cause Memory loss EXAM: CT HEAD WITHOUT CONTRAST TECHNIQUE: Contiguous axial images were obtained from the base of the skull through the vertex without intravenous contrast. RADIATION DOSE REDUCTION: This exam was performed according to the departmental  dose-optimization program which includes automated exposure control, adjustment of the mA and/or kV according to patient size and/or use of iterative reconstruction technique. COMPARISON:  None Available. FINDINGS: Brain: No evidence of acute infarction, hemorrhage, hydrocephalus, extra-axial collection or mass lesion/mass effect. Prior right frontal infarct. Vascular: Calcific intracranial atherosclerosis. No hyperdense vessel identified. Skull: No acute fracture. Sinuses/Orbits: Clear sinuses.  No acute orbital finding. Other: No mastoid effusions. IMPRESSION: No evidence of acute intracranial abnormality. Prior right frontal infarct. Electronically Signed   By: Margaretha Sheffield M.D.   On: 10/31/2021 09:54    ____________________________________________   PROCEDURES  Procedure(s) performed:   Procedures  None ____________________________________________   INITIAL IMPRESSION / ASSESSMENT AND PLAN / ED COURSE  Pertinent labs & imaging results that were available during my care of the patient were reviewed by me and considered in my medical decision making (see chart for details).   This patient is Presenting for Evaluation of AMS, which does require a range of treatment options, and is a complaint that involves a high risk of morbidity and mortality.  The Differential Diagnoses includes but is not exclusive to alcohol, illicit or prescription medications, intracranial pathology such as stroke, intracerebral hemorrhage, fever or infectious causes including sepsis, hypoxemia, uremia, trauma, endocrine related disorders such as diabetes, hypoglycemia, thyroid-related diseases, etc.   I did obtain Additional Historical Information from son  at bedside.   I decided to review pertinent External Data, and in summary patient admitted in February of this year for stroke along with metabolic encephalopathy thought to be due to stroke ultimately.    Clinical Laboratory Tests Ordered, included no  acute kidney injury. UA negative. No anemia. No UTI.   Radiologic Tests Ordered, included CT head. I independently interpreted the images and agree with radiology interpretation.   Cardiac Monitor Tracing which shows ? Afib vs irregular sinus rhythm.    Social Determinants of Health Risk patient is a non-smoker.   Medical Decision Making: Summary:  Patient presents to the emergency department with altered mental status after being found down on the floor this morning.  She is outside of any stroke window although my exam does not have any focal neurologic deficits.  She does appear more confused compared to her baseline according to son which I can appreciate here with some mild confusion.  No indication to activate code stroke.  She is negative for LVO by exam. Plan for CT head, labs, UA and will reassess. MRI not available at this facility.   Reevaluation with update and discussion with patient and son at bedside. Plan for transfer to [facility] for MRI. Dr. Vallery Ridge accepting. [facility] to transfer.   Considered admission but defer pending MRI.   Disposition: transfer to [facility] for MRI  ____________________________________________  FINAL CLINICAL IMPRESSION(S) / ED DIAGNOSES  Final diagnoses:  Transient alteration of awareness  Fall, initial encounter    Note:  This document was prepared using Dragon voice recognition software and may include unintentional dictation errors.  Nanda Quinton, MD, [facility] Emergency Medicine    Darthula Desa, [facility], MD 11/01/21 [phone]

## 2021-10-31 NOTE — ED Notes (Signed)
Called [facility] for Transfer to [facility] ED.  Dr. Fransico Michael accepting.  Spoke with [employer]

## 2021-10-31 NOTE — ED Triage Notes (Signed)
Pt arrives to ED with c/o fall and altered mental status. Per son pts LKN was 1030pm last night before bed. Son reports he found pt laying on floor this morning after a unwitnessed fall. Son reports pt is altered with example of memory loss, shaking, and unable to concentrate. Pt unable to recall fall and denies pain.

## 2023-03-21 NOTE — Progress Notes (Signed)
 [facility] Medicine [address] [place], [place] [address] Ph: [phone] Fax: [phone]   Medicare Annual Wellness   Chief Complaint  Patient presents with  . Medicare Wellness Visit     Assessment / Plan:   Allyiah was seen today for medicare wellness visit.  Diagnoses and all orders for this visit:  Encounter for Medicare annual examination with abnormal findings Yearly  Primary osteoarthritis of right hip Continue follow up with PT and ortho  Hypertension, essential -     CBC And Differential; Future -     Comprehensive Metabolic Panel; Future -     Comprehensive Metabolic Panel -     CBC And Differential Continue medications Follows with Cardiology  Acquired hypothyroidism Continue levothyroxine  25mg  daily  Obesity (BMI 30-39.9) Discussed dietary and exercise modifications  Chronic diastolic CHF (congestive heart failure) (*) Daily Weights Monitor fluid intake Avoid NSAID's Continue medications Continue follow up with Cardiology  History of CVA (cerebrovascular accident) Control B/P, lipids  Senile purpura (*) Acquired thrombocytopenia (*) Eliquis  2.5mg  BID  Vitamin D deficiency Continue daily supplementation   PLAN Yearly Medicare Annual Wellness.  Follow up in six months All health maintenance and immunizations reviewed. Encouraged heart healthy diet and active lifestyle within physical limitations.  Goal for cardio exercise is 30 5/7 days. Measures to promote bone health reviewed. If smoker counseled/coordinated care regarding tobacco use, longterm complications, and smoking cessation including available treatment options and potential benefits and side effects.   Reassurance, risks, benefits, and alternatives of the medications and treatment plan prescribed today were discussed, and patient expressed understanding. Plan follow-up as discussed or as needed if any worsening symptoms or change in condition.    Face  to face interview, assessment, counseling and chart review preformed this visit.      Patient's Medications       * Accurate as of March 21, 2023 11:59 PM. Reflects encounter med changes as of last refresh          Continued Medications      Instructions  apixaban  2.5 mg tablet Commonly known as: ELIQUIS   2.5 mg, Oral, 2 times a day   aspirin  EC tablet Commonly known as: ECOTRIN LOW DOSE  81 mg, Oral, Daily   atorvastatin  20 mg tablet Commonly known as: LIPITOR   20 mg, Oral, Daily   Biotin  5000 MCG Tabs  1 tablet, Oral   diltiazem HCl 120 mg 24 hr capsule Commonly known as: CARDIZEM CD,CARTIA XT  Oral, Daily   hydrocortisone 25 MG suppository Commonly known as: ANUSOL-HC  25 mg, Rectal, 2 times a day   levothyroxine  sodium 25 mcg tablet Commonly known as: SYNTHROID ,LOVOTHROID,LEVOXYL   25 mcg, Oral, Daily   magnesium  30 MG tablet  30 mg, Oral, 2 times a day   metoprolol  tartrate 25 mg tablet Commonly known as: LOPRESSOR   25 mg, Oral, 2 times a day   multivitamin tablet  1 tablet, Oral, Daily        Orders Placed This Encounter  Procedures  . CBC And Differential  . Comprehensive Metabolic Panel     Subjective:   Patient ID:  Carmen Hammond is a 89 y.o. (DOB July 21, 1928) female.     Patient presents with  . Medicare Wellness Visit      HPI:   She is having right hip pain.  She is using her walker to get up to use the bathroom and in for meals.  Pain was immediately sever.  History of THR  15 years ago. Reports that more pain in the morning tylenol  1,000mg  with breakfast.  Take second dose again around 1700.  Dentist one day ago.  Has a low grade infection, tooth in the middle of the bridge.  Dentists recommends that [facility] dental.  Has started taking amoxicillin at this time.  Has appointment scheduled to have this reviewed.   Takes a multivitamin every other day.  Has stopped taking vitamin D3 suplement related to  elevated.  Hypothyroidism and taking levothyxorine 25mcg daily in morning prior to first meal.  HLD and taking atorvastatin  80mg  daily.  Has atrial fibrillation, CHF and history of CVA and follow with cardiology.  Taking eliquis  2.5mg  BID, taking Cardizem 120mg  daily, metoprolol  25mg  BID.     Past Medical History, Past Surgery History, Allergies, Social History, and Family History were reviewed and updated.    Review of Systems  Constitutional:  Positive for activity change. Negative for appetite change, chills, diaphoresis, fatigue, fever and unexpected weight change.  HENT: Negative.    Respiratory: Negative.    Cardiovascular: Negative.   Gastrointestinal: Negative.   Musculoskeletal: Negative.   Skin: Negative.     Negative except noted above.   Objective:    BP 118/82 (BP Location: Left Upper Arm, Patient Position: Sitting)   Pulse 65   Temp 98 F (36.7 C) (Oral)   Resp 16   Ht 4' 11 (1.499 m)   Wt 128 lb (58.1 kg)   SpO2 98%   BMI 25.85 kg/m   Physical Exam Constitutional:      General: She is not in acute distress.    Appearance: She is normal weight. She is not ill-appearing, toxic-appearing or diaphoretic.  HENT:     Head: Normocephalic.  Cardiovascular:     Rate and Rhythm: Normal rate and regular rhythm.     Pulses: Normal pulses.     Heart sounds: Normal heart sounds. No murmur heard.    No gallop.  Musculoskeletal:     Right lower leg: No edema.     Left lower leg: No edema.  Pulmonary:     Effort: Pulmonary effort is normal.     Breath sounds: Normal breath sounds.  Skin:    General: Skin is warm.     Capillary Refill: Capillary refill takes less than 2 seconds.  Neurological:     Mental Status: She is alert. Mental status is at baseline.  Psychiatric:        Mood and Affect: Mood normal.        Behavior: Behavior normal.            Medicare AWV    Marcelline Alvetta Hidrogo is a 89 y.o. female who presents for her subsequent annual  wellness visit for Medicare.  Clinical documentation was reviewed and is accessible via encounter-level attachments.    Any physical exam components or additional concerns beyond the scope of the Annual Wellness Visit may be documented in a separate note within this encounter.  Medicare Required Components     Reviewed and updated this visit by provider: Tobacco  Allergies  Meds  Problems  Med Hx  Surg Hx  Fam Hx        Substance Use Disorder or Overdose Risk Score: 9 (03/15/2023 12:00 AM)   Patient Care Team: Ozell JAYSON Reilly, MD as PCP - General (Family Medicine) Charlyne FORBES Corpus, MD as Consulting Physician (Clinical Cardiac Electrophysiology) Dino CHRISTELLA Sable, MD as Consulting Physician (Neurological Surgery)  Vitals  Vitals:   03/21/23 1100  BP: 118/82  Patient Position: Sitting  Pulse: 65  Temp: 98 F (36.7 C)  TempSrc: Oral  Resp: 16  Height: 4' 11 (1.499 m)  Weight: 128 lb (58.1 kg)  SpO2: 98%  BMI (Calculated): 25.8    Disposition    See above No follow-ups on file.   Health maintenance issues were discussed with the patient.  A written plan was provided to the patient in the form of patient instructions in the After Visit Summary document.        Kyra McClanahan, NP 03/21/2023, 12:04 PM  *Some images could not be shown.

## 2023-10-09 NOTE — Progress Notes (Signed)
 [facility] Medicine [address] [place], [place] [address] Ph: [phone] Fax: [phone]    FOLLOW UP   Chief Complaint  Patient presents with  . Hypertension    Patient here in office today for 6 month follow up  Assessment / Plan:   Danyell was seen today for hypertension.  Diagnoses and all orders for this visit:  Hypertension, essential -     CBC And Differential; Future -     Comprehensive Metabolic Panel; Future -     Comprehensive Metabolic Panel -     CBC And Differential Continue medications  Acquired hypothyroidism -     TSH; Future -     TSH Continue levothyroxine  25mcg daily  Vitamin D deficiency -     Vitamin D 25 Hydroxy; Future -     Vitamin D 25 Hydroxy Continue supplementation  Chronic diastolic CHF (congestive heart failure) (*) Daily weights  Primary osteoarthritis involving multiple joints Doing well at this time  BMI 24.0-24.9, adult Discussed dietary and exercise modifications  Persistent atrial fibrillation (*) Continue medications and follow up with cardiology Continue eliquis  2.5mg  BID No S&S of any bleeding   Follow up in 6 months for Medicare Annual Wellness, sooner if needed     Patient's Medications       * Accurate as of October 09, 2023 11:59 PM. Reflects encounter med changes as of last refresh          Continued Medications      Instructions  amLODIPine besylate 2.5 mg tablet Commonly known as: NORVASC  2.5 mg, Oral, Daily   apixaban  2.5 mg tablet Commonly known as: ELIQUIS   2.5 mg, Oral, 2 times a day   aspirin  EC tablet Commonly known as: ECOTRIN LOW DOSE  81 mg, Daily   atorvastatin  20 mg tablet Commonly known as: LIPITOR   20 mg, Oral, Daily   Biotin  5000 MCG Tabs  1 tablet   hydrocortisone 25 MG suppository Commonly known as: ANUSOL-HC  25 mg, Rectal, 2 times a day   levothyroxine  sodium 25 mcg tablet Commonly known as: SYNTHROID ,LOVOTHROID,LEVOXYL   25 mcg, Oral,  Daily   magnesium  30 MG tablet  30 mg, 2 times a day   metoprolol  tartrate 25 mg tablet Commonly known as: LOPRESSOR   25 mg, Oral, 2 times a day   multivitamin tablet  1 tablet, Daily        Orders Placed This Encounter  Procedures  . CBC And Differential  . Comprehensive Metabolic Panel  . TSH  . Vitamin D 25 Hydroxy    Reassurance, risks, benefits, and alternatives of the medications and treatment plan prescribed today were discussed, and patient expressed understanding. Plan follow-up as discussed or as needed if any worsening symptoms or change in condition.     Face to face interview, assessment, counseling and chart review preformed this visit.         Subjective:   Patient ID:  Carmen Hammond is a 89 y.o. (DOB Sep 27, 1928) female.     Patient presents with  . Hypertension      HPI:  Patient in office today for follow up on HTN, Afib, HLD hypothyroidism. Here with son Carmen Hammond who assists with her care.  Also son Carmen Hammond laps around the house for exercise.  HTN, CH, afib taking amlodipine 2.5mg  daily, metoprolol  25mg  and eliquis  2.5mg  BID. Followed up with Dr Uvaldo on 07/01/2023 Diltiazem discontinued Son reports that her stamina has decreased since then She feels  good and continue to walk at least at a time. Continue to go upstairs once a week for shower. Pace is slower  HLD and taking atorvastatin  20mg  daily  Hypothyroidism taking levothyroxine  25mcg daily.  No change in appetite, has assistance with meal and medication preporation.   Review of Systems  Constitutional: Negative.   Respiratory: Negative.    Cardiovascular: Negative.   Gastrointestinal: Negative.     Negative except noted above.   Past Medical History, Past Surgery History, Allergies, Social History, and Family History were reviewed and updated.      Objective:    BP 116/74 (BP Location: Left Upper Arm, Patient Position: Sitting)   Pulse 58   Temp 98.3  F (36.8 C) (Oral)   Resp 16   Ht 4' 11 (1.499 m)   Wt 123 lb (55.8 kg)   SpO2 100%   BMI 24.84 kg/m   Physical Exam Vitals reviewed.  Constitutional:      General: She is not in acute distress.    Appearance: Normal appearance. She is not ill-appearing, toxic-appearing or diaphoretic.  HENT:     Head: Normocephalic.     Right Ear: Decreased hearing noted.     Left Ear: Decreased hearing noted.   Cardiovascular:     Rate and Rhythm: Normal rate and regular rhythm.     Pulses: Normal pulses.     Heart sounds: Normal heart sounds. No murmur heard.    No friction rub. No gallop.   Musculoskeletal:     Cervical back: Normal range of motion.  Pulmonary:     Effort: Pulmonary effort is normal. No respiratory distress.     Breath sounds: Normal breath sounds. No stridor. No wheezing, rhonchi or rales.  Chest:     Chest wall: No tenderness.   Skin:    General: Skin is warm.     Capillary Refill: Capillary refill takes less than 2 seconds.  Neurological:     Mental Status: She is alert and oriented to person, place, and time. Mental status is at baseline.   Psychiatric:        Mood and Affect: Mood normal.        Behavior: Behavior normal.        Thought Content: Thought content normal.        Judgment: Judgment normal.           Efrain Broaden, NP 10/09/2023, 11:57 AM  *Some images could not be shown.

## 2023-12-02 ENCOUNTER — Emergency Department (HOSPITAL_COMMUNITY)

## 2023-12-02 ENCOUNTER — Other Ambulatory Visit: Payer: Self-pay

## 2023-12-02 ENCOUNTER — Encounter (HOSPITAL_COMMUNITY): Payer: Self-pay

## 2023-12-02 ENCOUNTER — Observation Stay (HOSPITAL_COMMUNITY)
Admission: EM | Admit: 2023-12-02 | Discharge: 2023-12-03 | Disposition: A | Discharge status: Home Health | Attending: Internal Medicine | Admitting: Internal Medicine

## 2023-12-02 DIAGNOSIS — J810 Acute pulmonary edema: Secondary | ICD-10-CM

## 2023-12-02 DIAGNOSIS — R7989 Other specified abnormal findings of blood chemistry: Secondary | ICD-10-CM

## 2023-12-02 DIAGNOSIS — W19XXXA Unspecified fall, initial encounter: Secondary | ICD-10-CM | POA: Diagnosis not present

## 2023-12-02 DIAGNOSIS — Z7901 Long term (current) use of anticoagulants: Secondary | ICD-10-CM | POA: Diagnosis not present

## 2023-12-02 DIAGNOSIS — R55 Syncope and collapse: Principal | ICD-10-CM | POA: Diagnosis present

## 2023-12-02 DIAGNOSIS — I2489 Other forms of acute ischemic heart disease: Secondary | ICD-10-CM

## 2023-12-02 DIAGNOSIS — Z79899 Other long term (current) drug therapy: Secondary | ICD-10-CM | POA: Diagnosis not present

## 2023-12-02 DIAGNOSIS — S0990XA Unspecified injury of head, initial encounter: Secondary | ICD-10-CM

## 2023-12-02 DIAGNOSIS — D696 Thrombocytopenia, unspecified: Secondary | ICD-10-CM | POA: Diagnosis not present

## 2023-12-02 DIAGNOSIS — I4819 Other persistent atrial fibrillation: Secondary | ICD-10-CM

## 2023-12-02 DIAGNOSIS — I480 Paroxysmal atrial fibrillation: Secondary | ICD-10-CM | POA: Diagnosis not present

## 2023-12-02 DIAGNOSIS — D649 Anemia, unspecified: Secondary | ICD-10-CM | POA: Diagnosis not present

## 2023-12-02 DIAGNOSIS — I10 Essential (primary) hypertension: Secondary | ICD-10-CM | POA: Diagnosis present

## 2023-12-02 DIAGNOSIS — S0081XA Abrasion of other part of head, initial encounter: Principal | ICD-10-CM | POA: Insufficient documentation

## 2023-12-02 DIAGNOSIS — I5032 Chronic diastolic (congestive) heart failure: Secondary | ICD-10-CM | POA: Diagnosis present

## 2023-12-02 DIAGNOSIS — Z8673 Personal history of transient ischemic attack (TIA), and cerebral infarction without residual deficits: Secondary | ICD-10-CM | POA: Insufficient documentation

## 2023-12-02 DIAGNOSIS — E039 Hypothyroidism, unspecified: Secondary | ICD-10-CM | POA: Diagnosis not present

## 2023-12-02 DIAGNOSIS — I110 Hypertensive heart disease with heart failure: Secondary | ICD-10-CM | POA: Diagnosis not present

## 2023-12-02 DIAGNOSIS — R2689 Other abnormalities of gait and mobility: Secondary | ICD-10-CM | POA: Insufficient documentation

## 2023-12-02 DIAGNOSIS — Z7982 Long term (current) use of aspirin: Secondary | ICD-10-CM | POA: Diagnosis not present

## 2023-12-02 DIAGNOSIS — I639 Cerebral infarction, unspecified: Secondary | ICD-10-CM | POA: Diagnosis present

## 2023-12-02 LAB — I-STAT CHEM 8, ED
BUN: 13 mg/dL (ref 8–23)
Calcium, Ion: 1.13 mmol/L — ABNORMAL LOW (ref 1.15–1.40)
Chloride: 102 mmol/L (ref 98–111)
Creatinine, Ser: 0.6 mg/dL (ref 0.44–1.00)
Glucose, Bld: 151 mg/dL — ABNORMAL HIGH (ref 70–99)
HCT: 35 % — ABNORMAL LOW (ref 36.0–46.0)
Hemoglobin: 11.9 g/dL — ABNORMAL LOW (ref 12.0–15.0)
Potassium: 3.8 mmol/L (ref 3.5–5.1)
Sodium: 137 mmol/L (ref 135–145)
TCO2: 25 mmol/L (ref 22–32)

## 2023-12-02 LAB — CBC
HCT: 35 % — ABNORMAL LOW (ref 36.0–46.0)
Hemoglobin: 10.9 g/dL — ABNORMAL LOW (ref 12.0–15.0)
MCH: 30.8 pg (ref 26.0–34.0)
MCHC: 31.1 g/dL (ref 30.0–36.0)
MCV: 98.9 fL (ref 80.0–100.0)
Platelets: 123 K/uL — ABNORMAL LOW (ref 150–400)
RBC: 3.54 MIL/uL — ABNORMAL LOW (ref 3.87–5.11)
RDW: 15.1 % (ref 11.5–15.5)
WBC: 7.2 K/uL (ref 4.0–10.5)
nRBC: 0 % (ref 0.0–0.2)

## 2023-12-02 LAB — COMPREHENSIVE METABOLIC PANEL WITH GFR
ALT: 33 U/L (ref 0–44)
AST: 68 U/L — ABNORMAL HIGH (ref 15–41)
Albumin: 3.3 g/dL — ABNORMAL LOW (ref 3.5–5.0)
Alkaline Phosphatase: 156 U/L — ABNORMAL HIGH (ref 38–126)
Anion gap: 11 (ref 5–15)
BUN: 11 mg/dL (ref 8–23)
CO2: 23 mmol/L (ref 22–32)
Calcium: 8.8 mg/dL — ABNORMAL LOW (ref 8.9–10.3)
Chloride: 102 mmol/L (ref 98–111)
Creatinine, Ser: 0.7 mg/dL (ref 0.44–1.00)
GFR, Estimated: >60 mL/min (ref >60)
Glucose, Bld: 149 mg/dL — ABNORMAL HIGH (ref 70–99)
Potassium: 3.7 mmol/L (ref 3.5–5.1)
Sodium: 136 mmol/L (ref 135–145)
Total Bilirubin: 1.8 mg/dL — ABNORMAL HIGH (ref 0.0–1.2)
Total Protein: 6.1 g/dL — ABNORMAL LOW (ref 6.5–8.1)

## 2023-12-02 LAB — TROPONIN I (HIGH SENSITIVITY)
Troponin I (High Sensitivity): 52 ng/L — ABNORMAL HIGH (ref <18)
Troponin I (High Sensitivity): 218 ng/L (ref <18)

## 2023-12-02 LAB — PROTIME-INR
INR: 1.4 — ABNORMAL HIGH (ref 0.8–1.2)
Prothrombin Time: 17.5 s — ABNORMAL HIGH (ref 11.4–15.2)

## 2023-12-02 LAB — SAMPLE TO BLOOD BANK

## 2023-12-02 LAB — BRAIN NATRIURETIC PEPTIDE: B Natriuretic Peptide: 307.7 pg/mL — ABNORMAL HIGH (ref 0.0–100.0)

## 2023-12-02 LAB — ETHANOL: Alcohol, Ethyl (B): <15 mg/dL (ref <15)

## 2023-12-02 MED ORDER — MAGNESIUM OXIDE -MG SUPPLEMENT 400 (240 MG) MG PO TABS
400.0000 mg | ORAL_TABLET | Freq: Two times a day (BID) | ORAL | Status: DC
  Administered 2023-12-02 – 2023-12-03 (×2): 400 mg via ORAL
  Filled 2023-12-02 (×2): qty 1

## 2023-12-02 MED ORDER — ACETAMINOPHEN 650 MG RE SUPP: 650.0000 mg | Freq: Four times a day (QID) | RECTAL | Status: DC | PRN

## 2023-12-02 MED ORDER — METOPROLOL TARTRATE 25 MG PO TABS
25.0000 mg | ORAL_TABLET | Freq: Two times a day (BID) | ORAL | Status: DC
  Administered 2023-12-02 – 2023-12-03 (×2): 25 mg via ORAL
  Filled 2023-12-02 (×2): qty 1

## 2023-12-02 MED ORDER — IOHEXOL 350 MG/ML SOLN
55.0000 mL | Freq: Once | INTRAVENOUS | Status: AC | PRN
  Administered 2023-12-02: 55 mL via INTRAVENOUS

## 2023-12-02 MED ORDER — FENTANYL CITRATE PF 50 MCG/ML IJ SOSY
PREFILLED_SYRINGE | INTRAMUSCULAR | Status: AC
  Filled 2023-12-02: qty 1

## 2023-12-02 MED ORDER — ONDANSETRON HCL 4 MG/2ML IJ SOLN
INTRAMUSCULAR | Status: AC
  Filled 2023-12-02: qty 2

## 2023-12-02 MED ORDER — APIXABAN 2.5 MG PO TABS
2.5000 mg | ORAL_TABLET | Freq: Two times a day (BID) | ORAL | Status: DC
  Administered 2023-12-02 – 2023-12-03 (×2): 2.5 mg via ORAL
  Filled 2023-12-02 (×2): qty 1

## 2023-12-02 MED ORDER — ONDANSETRON HCL 4 MG/2ML IJ SOLN
4.0000 mg | Freq: Once | INTRAMUSCULAR | Status: AC
  Administered 2023-12-02: 4 mg via INTRAVENOUS

## 2023-12-02 MED ORDER — LEVOTHYROXINE SODIUM 25 MCG PO TABS
25.0000 ug | ORAL_TABLET | Freq: Every day | ORAL | Status: DC
  Administered 2023-12-03: 25 ug via ORAL
  Filled 2023-12-02: qty 1

## 2023-12-02 MED ORDER — BIOTIN 5000 MCG PO TABS: 5000.0000 ug | ORAL_TABLET | Freq: Every morning | ORAL | Status: DC

## 2023-12-02 MED ORDER — ACETAMINOPHEN 325 MG PO TABS: 650.0000 mg | ORAL_TABLET | Freq: Four times a day (QID) | ORAL | Status: DC | PRN

## 2023-12-02 MED ORDER — ATORVASTATIN CALCIUM 10 MG PO TABS
20.0000 mg | ORAL_TABLET | Freq: Every day | ORAL | Status: DC
  Administered 2023-12-02: 20 mg via ORAL
  Filled 2023-12-02: qty 1

## 2023-12-02 MED ORDER — ASPIRIN 325 MG PO TABS
325.0000 mg | ORAL_TABLET | Freq: Every day | ORAL | Status: DC
  Administered 2023-12-03: 325 mg via ORAL
  Filled 2023-12-02: qty 1

## 2023-12-02 MED ORDER — FENTANYL CITRATE PF 50 MCG/ML IJ SOSY
50.0000 ug | PREFILLED_SYRINGE | Freq: Once | INTRAMUSCULAR | Status: AC
  Administered 2023-12-02: 50 ug via INTRAVENOUS

## 2023-12-02 NOTE — Progress Notes (Signed)
 Orthopedic Tech Progress Note Patient Details:  Carmen Hammond March 04, 1929 [id number]  Patient ID: Carmen Hammond, female   DOB: April 06, 1928, 89 y.o.   MRN: [id number] Checked in for level 2 trauma.  [name] 12/02/2023, 7:55 PM

## 2023-12-02 NOTE — ED Provider Notes (Signed)
 [place] EMERGENCY DEPARTMENT AT [facility] Provider Note   CSN: [id number] Arrival date & time: 12/02/23  1948     Patient presents with: Carmen Hammond is a 89 y.o. female.   89 year old female with prior medical history as detailed below presents for evaluation.  Patient with unwitnessed fall or syncope.  Initial GCS was 3 per EMS.  Her mental status improved significantly during transport.  She did vomit once with EMS.  Patient with history of hypertension, A-fib on Eliquis , CHF, hypertension.  On arrival patient is alert.  GCS is 15.  She complains of left hip and upper leg pain.  She complains of mild nausea.  The history is provided by the patient and medical records.       Prior to Admission medications   Medication Sig Start Date End Date Taking? Authorizing Provider  aspirin  325 MG tablet Take 1 tablet (325 mg total) by mouth daily. 05/29/21   Krishnan, Sendil K, MD  atorvastatin  (LIPITOR ) 20 MG tablet Take 1 tablet (20 mg total) by mouth at bedtime. 05/28/21   Krishnan, Sendil K, MD  Biotin  5000 MCG TABS Take 5,000 mcg by mouth in the morning.    [provider]  cholecalciferol (VITAMIN D3) 25 MCG (1000 UNIT) tablet Take 1,000 Units by mouth daily with lunch.    [provider]  clopidogrel  (PLAVIX ) 75 MG tablet Take 1 tablet (75 mg total) by mouth daily. Patient not taking: Reported on 10/31/2021 05/29/21   Krishnan, Sendil K, MD  diltiazem (CARDIZEM CD) 120 MG 24 hr capsule Take 120 mg by mouth every morning. 03/27/21   [provider]  levothyroxine  (SYNTHROID ) 25 MCG tablet Take 25 mcg by mouth daily before breakfast. 03/27/21   [provider]  magnesium  30 MG tablet Take 1 tablet (30 mg total) by mouth 2 (two) times daily. 05/28/21   Krishnan, Sendil K, MD  metoprolol  tartrate (LOPRESSOR ) 50 MG tablet Take 50 mg by mouth 2 (two) times daily. 03/27/21   [provider]  Multiple Vitamin (MULTIVITAMIN  WITH MINERALS) TABS tablet Take 1 tablet by mouth daily with lunch.    [provider]  traMADol  (ULTRAM ) 50 MG tablet Take 1 tablet (50 mg total) by mouth every 6 (six) hours as needed. Patient taking differently: Take 50 mg by mouth every 6 (six) hours as needed (pain). 08/20/13   Murrell Kuba, MD    Allergies: Meperidine hcl, Nitrofurantoin, Oxycodone-acetaminophen , and Sulfa antibiotics    Review of Systems  All other systems reviewed and are negative.   Updated Vital Signs BP (!) 148/64   Pulse (!) 105   Temp 98.2 F (36.8 C) (Oral)   Resp (!) 25   SpO2 98%   Physical Exam Vitals and nursing note reviewed.  Constitutional:      General: She is not in acute distress.    Appearance: Normal appearance. She is well-developed.  HENT:     Head: Normocephalic.     Comments: Superficial abrasions to the face. Eyes:     Conjunctiva/sclera: Conjunctivae normal.     Pupils: Pupils are equal, round, and reactive to light.  Cardiovascular:     Rate and Rhythm: Normal rate and regular rhythm.     Heart sounds: Normal heart sounds.  Pulmonary:     Effort: Pulmonary effort is normal. No respiratory distress.     Breath sounds: Normal breath sounds.  Abdominal:     General: There is no distension.  Palpations: Abdomen is soft.     Tenderness: There is no abdominal tenderness.  Musculoskeletal:        General: Tenderness present. No deformity. Normal range of motion.     Cervical back: Normal range of motion and neck supple.     Comments: Tender to the left lateral hip.  Skin:    General: Skin is warm and dry.  Neurological:     General: No focal deficit present.     Mental Status: She is alert and oriented to person, place, and time.     (all labs ordered are listed, but only abnormal results are displayed) Labs Reviewed  I-STAT CHEM 8, ED - Abnormal; Notable for the following components:      Result Value   Glucose, Bld 151 (*)    Calcium , Ion 1.13 (*)     Hemoglobin 11.9 (*)    HCT 35.0 (*)    All other components within normal limits  COMPREHENSIVE METABOLIC PANEL WITH GFR  CBC  ETHANOL  URINALYSIS, ROUTINE W REFLEX MICROSCOPIC  PROTIME-INR  BRAIN NATRIURETIC PEPTIDE  I-STAT CG4 LACTIC ACID, ED  SAMPLE TO BLOOD BANK  TROPONIN I (HIGH SENSITIVITY)    EKG: None  Radiology: No results found.   Procedures   Medications Ordered in the ED  ondansetron  (ZOFRAN ) injection 4 mg (has no administration in time range)  fentaNYL  (SUBLIMAZE ) injection 50 mcg (50 mcg Intravenous Given 12/02/23 1959)                                    Medical Decision Making Patient presents after unwitnessed fall.  Patient's mental status was significantly depressed on initial EMS evaluation.  They report that GCS was 3 in the field.  During transport her mental status improved rapidly.  Patient with GCS 15 on arrival.  Patient with primary complaint of left hip pain on initial ED evaluation.  Traumatic workup is without clear evidence of significant traumatic injury.  Patient would benefit from mission for workup of unwitnessed syncope.  Hospitalist service made aware of case.  Amount and/or Complexity of Data Reviewed Labs: ordered. Radiology: ordered.  Risk Prescription drug management. Decision regarding hospitalization.   CRITICAL CARE Performed by: Maude JAYSON Galloway   Total critical care time: 30 minutes  Critical care time was exclusive of separately billable procedures and treating other patients.  Critical care was necessary to treat or prevent imminent or life-threatening deterioration.  Critical care was time spent personally by me on the following activities: development of treatment plan with patient and/or surrogate as well as nursing, discussions with consultants, evaluation of patient's response to treatment, examination of patient, obtaining history from patient or surrogate, ordering and performing treatments and  interventions, ordering and review of laboratory studies, ordering and review of radiographic studies, pulse oximetry and re-evaluation of patient's condition.      Final diagnoses:  Syncope, unspecified syncope type  Injury of head, initial encounter    ED Discharge Orders     None          Galloway Maude JAYSON, MD 12/02/23 2229

## 2023-12-02 NOTE — ED Notes (Signed)
 Trial on room air, patient O2 saturation 85%, placed back on 4LNC.

## 2023-12-02 NOTE — ED Notes (Signed)
 Patient transported to CT

## 2023-12-02 NOTE — H&P (Signed)
 History and Physical    Carmen Hammond FMW:[id number] DOB: April 08, 1928 DOA: 12/02/2023  Patient coming from: Home.  Chief Complaint: Loss of consciousness.  HPI: Carmen Hammond is a 89 y.o. female with history of SVT status post ablation with frequent recurrences, persistent atrial fibrillation rate controlled on metoprolol , hypertension, history of CVA in 2023 was brought to the ER after patient lost consciousness at home.  Per patient's family was at the bedside patient was taken to the bathroom and sat on the commode.  Few minutes later patient lost consciousness and was lying on the floor.  Per family patient may have lost consciousness for about 5 minutes.  Prior to or after the episode patient did not complain of any chest pain or shortness of breath.  Patient usually ambulates with the help of her walker and also since her stroke in 2023 she has been having cognitive difficulties.  ED Course: In the ER CT head C-spine maxillofacial chest abdomen pelvis does not show anything acute.  Shows some signs of possible pulmonary edema.  EKG shows A-fib rate controlled at around 100 bpm.  Labs show troponin of 52 and 218.  BNP of 307.  Hemoglobin 10.9 platelets 123.  At the time of my exam patient is alert oriented following commands and moving all extremities.  Review of Systems: As per HPI, rest all negative.   Past Medical History:  Diagnosis Date   Arthritis    Dental crowns present    Dysrhythmia 2012   cardiac ablation   Hypertension    Hypothyroidism    Wears glasses     Past Surgical History:  Procedure Laterality Date   ABLATION OF DYSRHYTHMIC FOCUS  04/01/2010   [facility]   BRAIN SURGERY  07/19/2017   aneurysm endovascular exclusion   CARPAL TUNNEL RELEASE Left 08/20/2013   Procedure: LEFT CARPAL TUNNEL RELEASE;  Surgeon: Arley JONELLE Curia, MD;  Location: [facility];  Service: Orthopedics;  Laterality: Left;   CESAREAN SECTION  [phone]   x4   EYE SURGERY      both cataracts   LUMBAR FUSION  04/02/2007   TOTAL HIP ARTHROPLASTY  04/01/2006   left   TOTAL HIP ARTHROPLASTY  04/01/2005   right     reports that she has never smoked. She does not have any smokeless tobacco history on file. She reports that she does not drink alcohol and does not use drugs.  Allergies  Allergen Reactions   Meperidine Hcl Nausea And Vomiting   Nitrofurantoin Nausea And Vomiting   Oxycodone-Acetaminophen  Other (See Comments)    confused     Sulfa Antibiotics Nausea And Vomiting    History reviewed. No pertinent family history.  Prior to Admission medications   Medication Sig Start Date End Date Taking? Authorizing Provider  aspirin  325 MG tablet Take 1 tablet (325 mg total) by mouth daily. 05/29/21   Krishnan, Sendil K, MD  atorvastatin  (LIPITOR ) 20 MG tablet Take 1 tablet (20 mg total) by mouth at bedtime. 05/28/21   Krishnan, Sendil K, MD  Biotin  5000 MCG TABS Take 5,000 mcg by mouth in the morning.    [provider]  cholecalciferol (VITAMIN D3) 25 MCG (1000 UNIT) tablet Take 1,000 Units by mouth daily with lunch.    [provider]  clopidogrel  (PLAVIX ) 75 MG tablet Take 1 tablet (75 mg total) by mouth daily. Patient not taking: Reported on 10/31/2021 05/29/21   Krishnan, Sendil K, MD  diltiazem (CARDIZEM CD) 120 MG 24  hr capsule Take 120 mg by mouth every morning. 03/27/21   [provider]  levothyroxine  (SYNTHROID ) 25 MCG tablet Take 25 mcg by mouth daily before breakfast. 03/27/21   [provider]  magnesium  30 MG tablet Take 1 tablet (30 mg total) by mouth 2 (two) times daily. 05/28/21   Krishnan, Sendil K, MD  metoprolol  tartrate (LOPRESSOR ) 50 MG tablet Take 50 mg by mouth 2 (two) times daily. 03/27/21   [provider]  Multiple Vitamin (MULTIVITAMIN WITH MINERALS) TABS tablet Take 1 tablet by mouth daily with lunch.    [provider]  traMADol  (ULTRAM ) 50 MG tablet Take 1 tablet (50 mg total) by  mouth every 6 (six) hours as needed. Patient taking differently: Take 50 mg by mouth every 6 (six) hours as needed (pain). 08/20/13   Murrell Kuba, MD    Physical Exam: Constitutional: moderately built and nourished. Vitals:   12/02/23 1949 12/02/23 1953 12/02/23 2115  BP:  (!) 148/64 134/69  Pulse:  (!) 105 88  Resp:  (!) 25 16  Temp:  98.2 F (36.8 C)   TempSrc:  Oral   SpO2: 95% 98% 100%   Eyes: Anicteric no pallor. ENMT: No discharge from the ears eyes nose or mouth. Neck: No mass felt.  No neck rigidity. Respiratory: No rhonchi or crepitations. Cardiovascular: S1-S2 heard. Abdomen: Soft nontender bowel sound present. Musculoskeletal: Bilateral hip pain which patient's family states is chronic. Skin: No rash. Neurologic: Alert awake oriented to her name.  Moving all extremities. Psychiatric: Oriented to her name.   Labs on Admission: I have personally reviewed following labs and imaging studies  CBC: Recent Labs  Lab 12/02/23 1950 12/02/23 1958  WBC 7.2  --   HGB 10.9* 11.9*  HCT 35.0* 35.0*  MCV 98.9  --   PLT 123*  --    Basic Metabolic Panel: Recent Labs  Lab 12/02/23 1950 12/02/23 1958  NA 136 137  K 3.7 3.8  CL 102 102  CO2 23  --   GLUCOSE 149* 151*  BUN 11 13  CREATININE 0.70 0.60  CALCIUM  8.8*  --    GFR: CrCl cannot be calculated (Unknown ideal weight.). Liver Function Tests: Recent Labs  Lab 12/02/23 1950  AST 68*  ALT 33  ALKPHOS 156*  BILITOT 1.8*  PROT 6.1*  ALBUMIN 3.3*   No results for input(s): LIPASE, AMYLASE in the last 168 hours. No results for input(s): AMMONIA in the last 168 hours. Coagulation Profile: Recent Labs  Lab 12/02/23 1950  INR 1.4*   Cardiac Enzymes: No results for input(s): CKTOTAL, CKMB, CKMBINDEX, TROPONINI in the last 168 hours. BNP (last 3 results) No results for input(s): PROBNP in the last 8760 hours. HbA1C: No results for input(s): HGBA1C in the last 72 hours. CBG: No  results for input(s): GLUCAP in the last 168 hours. Lipid Profile: No results for input(s): CHOL, HDL, LDLCALC, TRIG, CHOLHDL, LDLDIRECT in the last 72 hours. Thyroid  Function Tests: No results for input(s): TSH, T4TOTAL, FREET4, T3FREE, THYROIDAB in the last 72 hours. Anemia Panel: No results for input(s): VITAMINB12, FOLATE, FERRITIN, TIBC, IRON, RETICCTPCT in the last 72 hours. Urine analysis:    Component Value Date/Time   COLORURINE COLORLESS (A) 10/31/2021 1106   APPEARANCEUR CLEAR 10/31/2021 1106   LABSPEC <1.005 (L) 10/31/2021 1106   PHURINE 7.5 10/31/2021 1106   GLUCOSEU NEGATIVE 10/31/2021 1106   HGBUR NEGATIVE 10/31/2021 1106   BILIRUBINUR NEGATIVE 10/31/2021 1106   KETONESUR NEGATIVE 10/31/2021 1106  PROTEINUR NEGATIVE 10/31/2021 1106   NITRITE NEGATIVE 10/31/2021 1106   LEUKOCYTESUR NEGATIVE 10/31/2021 1106   Sepsis Labs: @LABRCNTIP (procalcitonin:4,lacticidven:4) )No results found for this or any previous visit (from the past 240 hours).   Radiological Exams on Admission: CT HEAD WO CONTRAST Result Date: 12/02/2023 CLINICAL DATA:  Trauma, fall EXAM: CT HEAD, FACIAL BONES AND CERVICAL SPINE WITHOUT CONTRAST CT CHEST, ABDOMEN, AND PELVIS WITH CONTRAST TECHNIQUE: Multi detector CT imaging of the head, facial bones, and cervical spine was performed without intravenous contrast. Multidetector CT imaging of the chest, abdomen and pelvis was performed following the standard protocol during bolus administration of intravenous contrast. RADIATION DOSE REDUCTION: This exam was performed according to the departmental dose-optimization program which includes automated exposure control, adjustment of the mA and/or kV according to patient size and/or use of iterative reconstruction technique. CONTRAST:  55mL OMNIPAQUE  IOHEXOL  350 MG/ML SOLN COMPARISON:  10/31/2021 FINDINGS: CT HEAD FINDINGS Brain: No evidence of acute infarction, hemorrhage,  hydrocephalus, extra-axial collection or mass lesion/mass effect. Right anterior MCA territory encephalomalacia. Vascular: No hyperdense vessel or unexpected calcification. CT FACIAL BONES FINDINGS Skull: Normal. Negative for fracture or focal lesion. Facial bones: No displaced fractures or dislocations. Sinuses/Orbits: No acute finding. Other: Dense cement and/or coil material in the left suboccipital soft tissues. CT CERVICAL SPINE FINDINGS Alignment: Degenerative straightening of the normal cervical lordosis. Degenerative anterolisthesis of C3 on C4. Skull base and vertebrae: No acute fracture. No primary bone lesion or focal pathologic process. Soft tissues and spinal canal: No prevertebral fluid or swelling. No visible canal hematoma. Disc levels: Moderate multilevel cervical disc degenerative disease, worst from C4-C7. Other: None. CT CHEST FINDINGS Cardiovascular: Aortic atherosclerosis. Cardiomegaly. Enlargement of the main pulmonary artery measuring up to 3.8 cm in caliber. No pericardial effusion. Mediastinum/Nodes: No enlarged mediastinal, hilar, or axillary lymph nodes. Thyroid  gland, trachea, and esophagus demonstrate no significant findings. Lungs/Pleura: Trace pleural effusions and associated interlobular septal thickening. Dependent bibasilar scarring or atelectasis. Musculoskeletal: No chest wall abnormality. No acute osseous findings. CT ABDOMEN PELVIS FINDINGS Hepatobiliary: No solid liver abnormality is seen. No gallstones, gallbladder wall thickening, or biliary dilatation. Pancreas: Unremarkable. No pancreatic ductal dilatation or surrounding inflammatory changes. Spleen: Normal in size without significant abnormality. Adrenals/Urinary Tract: Adrenal glands are unremarkable. Kidneys are normal, without renal calculi, solid lesion, or hydronephrosis. Bladder is unremarkable. Stomach/Bowel: Stomach is within normal limits. Appendix appears normal. No evidence of bowel wall thickening, distention,  or inflammatory changes. Vascular/Lymphatic: Aortic atherosclerosis. No enlarged abdominal or pelvic lymph nodes. Reproductive: Calcified uterine fibroids. Air within the fundal endometrial cavity. Other: No abdominal wall hernia or abnormality. No ascites. Musculoskeletal: No acute osseous findings. Status post bilateral hip total arthroplasty with dense metallic streak artifact. IMPRESSION: 1. No acute intracranial pathology. Right anterior MCA territory encephalomalacia. 2. No displaced fractures or dislocations of the facial bones. 3. No fracture or static subluxation of the cervical spine. 4. No CT evidence of acute traumatic injury to the chest, abdomen, or pelvis. 5. Cardiomegaly. Trace pleural effusions and associated interlobular septal thickening, consistent with mild pulmonary edema. 6. Enlargement of the main pulmonary artery, as can be seen in pulmonary hypertension. 7. Calcified uterine fibroids. Air within the fundal endometrial cavity, of uncertain significance, possibly related to prior instrumentation or surgery although of doubtful acute clinical significance. Aortic Atherosclerosis (ICD10-I70.0). Electronically Signed   By: Marolyn JONETTA Jaksch M.D.   On: 12/02/2023 21:35   CT CERVICAL SPINE WO CONTRAST Result Date: 12/02/2023 CLINICAL DATA:  Trauma, fall EXAM: CT HEAD, FACIAL  BONES AND CERVICAL SPINE WITHOUT CONTRAST CT CHEST, ABDOMEN, AND PELVIS WITH CONTRAST TECHNIQUE: Multi detector CT imaging of the head, facial bones, and cervical spine was performed without intravenous contrast. Multidetector CT imaging of the chest, abdomen and pelvis was performed following the standard protocol during bolus administration of intravenous contrast. RADIATION DOSE REDUCTION: This exam was performed according to the departmental dose-optimization program which includes automated exposure control, adjustment of the mA and/or kV according to patient size and/or use of iterative reconstruction technique. CONTRAST:   55mL OMNIPAQUE  IOHEXOL  350 MG/ML SOLN COMPARISON:  10/31/2021 FINDINGS: CT HEAD FINDINGS Brain: No evidence of acute infarction, hemorrhage, hydrocephalus, extra-axial collection or mass lesion/mass effect. Right anterior MCA territory encephalomalacia. Vascular: No hyperdense vessel or unexpected calcification. CT FACIAL BONES FINDINGS Skull: Normal. Negative for fracture or focal lesion. Facial bones: No displaced fractures or dislocations. Sinuses/Orbits: No acute finding. Other: Dense cement and/or coil material in the left suboccipital soft tissues. CT CERVICAL SPINE FINDINGS Alignment: Degenerative straightening of the normal cervical lordosis. Degenerative anterolisthesis of C3 on C4. Skull base and vertebrae: No acute fracture. No primary bone lesion or focal pathologic process. Soft tissues and spinal canal: No prevertebral fluid or swelling. No visible canal hematoma. Disc levels: Moderate multilevel cervical disc degenerative disease, worst from C4-C7. Other: None. CT CHEST FINDINGS Cardiovascular: Aortic atherosclerosis. Cardiomegaly. Enlargement of the main pulmonary artery measuring up to 3.8 cm in caliber. No pericardial effusion. Mediastinum/Nodes: No enlarged mediastinal, hilar, or axillary lymph nodes. Thyroid  gland, trachea, and esophagus demonstrate no significant findings. Lungs/Pleura: Trace pleural effusions and associated interlobular septal thickening. Dependent bibasilar scarring or atelectasis. Musculoskeletal: No chest wall abnormality. No acute osseous findings. CT ABDOMEN PELVIS FINDINGS Hepatobiliary: No solid liver abnormality is seen. No gallstones, gallbladder wall thickening, or biliary dilatation. Pancreas: Unremarkable. No pancreatic ductal dilatation or surrounding inflammatory changes. Spleen: Normal in size without significant abnormality. Adrenals/Urinary Tract: Adrenal glands are unremarkable. Kidneys are normal, without renal calculi, solid lesion, or hydronephrosis.  Bladder is unremarkable. Stomach/Bowel: Stomach is within normal limits. Appendix appears normal. No evidence of bowel wall thickening, distention, or inflammatory changes. Vascular/Lymphatic: Aortic atherosclerosis. No enlarged abdominal or pelvic lymph nodes. Reproductive: Calcified uterine fibroids. Air within the fundal endometrial cavity. Other: No abdominal wall hernia or abnormality. No ascites. Musculoskeletal: No acute osseous findings. Status post bilateral hip total arthroplasty with dense metallic streak artifact. IMPRESSION: 1. No acute intracranial pathology. Right anterior MCA territory encephalomalacia. 2. No displaced fractures or dislocations of the facial bones. 3. No fracture or static subluxation of the cervical spine. 4. No CT evidence of acute traumatic injury to the chest, abdomen, or pelvis. 5. Cardiomegaly. Trace pleural effusions and associated interlobular septal thickening, consistent with mild pulmonary edema. 6. Enlargement of the main pulmonary artery, as can be seen in pulmonary hypertension. 7. Calcified uterine fibroids. Air within the fundal endometrial cavity, of uncertain significance, possibly related to prior instrumentation or surgery although of doubtful acute clinical significance. Aortic Atherosclerosis (ICD10-I70.0). Electronically Signed   By: Marolyn JONETTA Jaksch M.D.   On: 12/02/2023 21:35   CT CHEST ABDOMEN PELVIS W CONTRAST Result Date: 12/02/2023 CLINICAL DATA:  Trauma, fall EXAM: CT HEAD, FACIAL BONES AND CERVICAL SPINE WITHOUT CONTRAST CT CHEST, ABDOMEN, AND PELVIS WITH CONTRAST TECHNIQUE: Multi detector CT imaging of the head, facial bones, and cervical spine was performed without intravenous contrast. Multidetector CT imaging of the chest, abdomen and pelvis was performed following the standard protocol during bolus administration of intravenous contrast. RADIATION DOSE REDUCTION: This  exam was performed according to the departmental dose-optimization program which  includes automated exposure control, adjustment of the mA and/or kV according to patient size and/or use of iterative reconstruction technique. CONTRAST:  55mL OMNIPAQUE  IOHEXOL  350 MG/ML SOLN COMPARISON:  10/31/2021 FINDINGS: CT HEAD FINDINGS Brain: No evidence of acute infarction, hemorrhage, hydrocephalus, extra-axial collection or mass lesion/mass effect. Right anterior MCA territory encephalomalacia. Vascular: No hyperdense vessel or unexpected calcification. CT FACIAL BONES FINDINGS Skull: Normal. Negative for fracture or focal lesion. Facial bones: No displaced fractures or dislocations. Sinuses/Orbits: No acute finding. Other: Dense cement and/or coil material in the left suboccipital soft tissues. CT CERVICAL SPINE FINDINGS Alignment: Degenerative straightening of the normal cervical lordosis. Degenerative anterolisthesis of C3 on C4. Skull base and vertebrae: No acute fracture. No primary bone lesion or focal pathologic process. Soft tissues and spinal canal: No prevertebral fluid or swelling. No visible canal hematoma. Disc levels: Moderate multilevel cervical disc degenerative disease, worst from C4-C7. Other: None. CT CHEST FINDINGS Cardiovascular: Aortic atherosclerosis. Cardiomegaly. Enlargement of the main pulmonary artery measuring up to 3.8 cm in caliber. No pericardial effusion. Mediastinum/Nodes: No enlarged mediastinal, hilar, or axillary lymph nodes. Thyroid  gland, trachea, and esophagus demonstrate no significant findings. Lungs/Pleura: Trace pleural effusions and associated interlobular septal thickening. Dependent bibasilar scarring or atelectasis. Musculoskeletal: No chest wall abnormality. No acute osseous findings. CT ABDOMEN PELVIS FINDINGS Hepatobiliary: No solid liver abnormality is seen. No gallstones, gallbladder wall thickening, or biliary dilatation. Pancreas: Unremarkable. No pancreatic ductal dilatation or surrounding inflammatory changes. Spleen: Normal in size without  significant abnormality. Adrenals/Urinary Tract: Adrenal glands are unremarkable. Kidneys are normal, without renal calculi, solid lesion, or hydronephrosis. Bladder is unremarkable. Stomach/Bowel: Stomach is within normal limits. Appendix appears normal. No evidence of bowel wall thickening, distention, or inflammatory changes. Vascular/Lymphatic: Aortic atherosclerosis. No enlarged abdominal or pelvic lymph nodes. Reproductive: Calcified uterine fibroids. Air within the fundal endometrial cavity. Other: No abdominal wall hernia or abnormality. No ascites. Musculoskeletal: No acute osseous findings. Status post bilateral hip total arthroplasty with dense metallic streak artifact. IMPRESSION: 1. No acute intracranial pathology. Right anterior MCA territory encephalomalacia. 2. No displaced fractures or dislocations of the facial bones. 3. No fracture or static subluxation of the cervical spine. 4. No CT evidence of acute traumatic injury to the chest, abdomen, or pelvis. 5. Cardiomegaly. Trace pleural effusions and associated interlobular septal thickening, consistent with mild pulmonary edema. 6. Enlargement of the main pulmonary artery, as can be seen in pulmonary hypertension. 7. Calcified uterine fibroids. Air within the fundal endometrial cavity, of uncertain significance, possibly related to prior instrumentation or surgery although of doubtful acute clinical significance. Aortic Atherosclerosis (ICD10-I70.0). Electronically Signed   By: Marolyn JONETTA Jaksch M.D.   On: 12/02/2023 21:35   CT Maxillofacial Wo Contrast Result Date: 12/02/2023 CLINICAL DATA:  Trauma, fall EXAM: CT HEAD, FACIAL BONES AND CERVICAL SPINE WITHOUT CONTRAST CT CHEST, ABDOMEN, AND PELVIS WITH CONTRAST TECHNIQUE: Multi detector CT imaging of the head, facial bones, and cervical spine was performed without intravenous contrast. Multidetector CT imaging of the chest, abdomen and pelvis was performed following the standard protocol during bolus  administration of intravenous contrast. RADIATION DOSE REDUCTION: This exam was performed according to the departmental dose-optimization program which includes automated exposure control, adjustment of the mA and/or kV according to patient size and/or use of iterative reconstruction technique. CONTRAST:  55mL OMNIPAQUE  IOHEXOL  350 MG/ML SOLN COMPARISON:  10/31/2021 FINDINGS: CT HEAD FINDINGS Brain: No evidence of acute infarction, hemorrhage, hydrocephalus, extra-axial collection or mass  lesion/mass effect. Right anterior MCA territory encephalomalacia. Vascular: No hyperdense vessel or unexpected calcification. CT FACIAL BONES FINDINGS Skull: Normal. Negative for fracture or focal lesion. Facial bones: No displaced fractures or dislocations. Sinuses/Orbits: No acute finding. Other: Dense cement and/or coil material in the left suboccipital soft tissues. CT CERVICAL SPINE FINDINGS Alignment: Degenerative straightening of the normal cervical lordosis. Degenerative anterolisthesis of C3 on C4. Skull base and vertebrae: No acute fracture. No primary bone lesion or focal pathologic process. Soft tissues and spinal canal: No prevertebral fluid or swelling. No visible canal hematoma. Disc levels: Moderate multilevel cervical disc degenerative disease, worst from C4-C7. Other: None. CT CHEST FINDINGS Cardiovascular: Aortic atherosclerosis. Cardiomegaly. Enlargement of the main pulmonary artery measuring up to 3.8 cm in caliber. No pericardial effusion. Mediastinum/Nodes: No enlarged mediastinal, hilar, or axillary lymph nodes. Thyroid  gland, trachea, and esophagus demonstrate no significant findings. Lungs/Pleura: Trace pleural effusions and associated interlobular septal thickening. Dependent bibasilar scarring or atelectasis. Musculoskeletal: No chest wall abnormality. No acute osseous findings. CT ABDOMEN PELVIS FINDINGS Hepatobiliary: No solid liver abnormality is seen. No gallstones, gallbladder wall thickening, or  biliary dilatation. Pancreas: Unremarkable. No pancreatic ductal dilatation or surrounding inflammatory changes. Spleen: Normal in size without significant abnormality. Adrenals/Urinary Tract: Adrenal glands are unremarkable. Kidneys are normal, without renal calculi, solid lesion, or hydronephrosis. Bladder is unremarkable. Stomach/Bowel: Stomach is within normal limits. Appendix appears normal. No evidence of bowel wall thickening, distention, or inflammatory changes. Vascular/Lymphatic: Aortic atherosclerosis. No enlarged abdominal or pelvic lymph nodes. Reproductive: Calcified uterine fibroids. Air within the fundal endometrial cavity. Other: No abdominal wall hernia or abnormality. No ascites. Musculoskeletal: No acute osseous findings. Status post bilateral hip total arthroplasty with dense metallic streak artifact. IMPRESSION: 1. No acute intracranial pathology. Right anterior MCA territory encephalomalacia. 2. No displaced fractures or dislocations of the facial bones. 3. No fracture or static subluxation of the cervical spine. 4. No CT evidence of acute traumatic injury to the chest, abdomen, or pelvis. 5. Cardiomegaly. Trace pleural effusions and associated interlobular septal thickening, consistent with mild pulmonary edema. 6. Enlargement of the main pulmonary artery, as can be seen in pulmonary hypertension. 7. Calcified uterine fibroids. Air within the fundal endometrial cavity, of uncertain significance, possibly related to prior instrumentation or surgery although of doubtful acute clinical significance. Aortic Atherosclerosis (ICD10-I70.0). Electronically Signed   By: Marolyn JONETTA Jaksch M.D.   On: 12/02/2023 21:35   DG Chest Port 1 View Result Date: 12/02/2023 CLINICAL DATA:  Trauma EXAM: PORTABLE CHEST - 1 VIEW COMPARISON:  January 06, 2006 FINDINGS: Lower lung volumes with bronchovascular crowding. No focal airspace consolidation, pleural effusion, or pneumothorax. Mild cardiomegaly. Tortuous aorta  with aortic atherosclerosis. No acute fracture or destructive lesions. Multilevel thoracic osteophytosis. IMPRESSION: Low lung volumes.  Otherwise, no acute cardiopulmonary abnormality. Electronically Signed   By: Rogelia Myers M.D.   On: 12/02/2023 20:29   DG Femur Portable Min 2 Views Right Result Date: 12/02/2023 CLINICAL DATA:  Left leg pain EXAM: RIGHT FEMUR PORTABLE 2 VIEW COMPARISON:  None Available. FINDINGS: Left hip arthroplasty appears anatomically aligned.No acute fracture or dislocation. No periprosthetic lucency to suggest loosening. Osteoarthritis of the knee. Diffuse peripheral vascular atherosclerosis. IMPRESSION: No acute fracture or dislocation status post left hip arthroplasty. Electronically Signed   By: Rogelia Myers M.D.   On: 12/02/2023 20:28   DG Pelvis 1-2 Views Result Date: 12/02/2023 CLINICAL DATA:  [id number] Hip pain [id number] EXAM: PELVIS - 1-2 VIEW COMPARISON:  None Available. FINDINGS: Diffuse osteopenia. Bilateral hip arthroplasties  appear anatomically aligned without dislocation.Cortical irregularity along the pelvic rim at the right puboacetabular junction.No acute hip fracture or dislocation.Lumbosacral fusion hardware at L5-S1 with disc replacement.Soft tissues are unremarkable. IMPRESSION: Diffuse osteopenia. Cortical irregularity along the pelvic rim at the right puboacetabular junction, worrisome for mildly displaced fracture. A follow-up pelvic CT is recommended for further characterization. Electronically Signed   By: Rogelia Myers M.D.   On: 12/02/2023 20:27    EKG: Independently reviewed.  A-fib rate controlled.  Assessment/Plan Principal Problem:   Syncope Active Problems:   Stroke (HCC)   HTN (hypertension)   Hypothyroidism   Chronic diastolic CHF (congestive heart failure) (HCC)   PAF (paroxysmal atrial fibrillation) (HCC)   Anemia   Thrombocytopenia (HCC)    Syncope/fall cause not clear.  Has prior history of SVT status post ablation and also has  persistent A-fib.  Will closely monitor telemetry check 2D echo. Elevated troponin denies any chest pain.  Check 2D echo.  Will consult cardiology.  Continue statins aspirin  and beta-blockers. History of stroke on Eliquis  and statins and aspirin . History of A-fib on beta-blockers and Eliquis . Hypertension on amlodipine. Anemia appears to be new.  Check anemia panel follow CBC. Thrombocytopenia appears to be new.  Follow CBC closely. Hypothyroidism on Synthroid .  Since patient has syncope with elevated troponin will need close monitoring and further workup and more than 2 midnight stay.   DVT prophylaxis: Eliquis . Code Status: DNR. Family Communication: Patient's family at the bedside. Disposition Plan: Monitored bed. Consults called: Cardiology. Admission status: Observation.

## 2023-12-02 NOTE — ED Triage Notes (Signed)
 Patient BIB [facility] from home due to fall. Patient takes eliquis  for afib. Patient in afib rvr with EMS. Initial GCS 3, upon arrival GCS 14. Fall from standing. Unwitnessed fall. Received 4mg  zofran  and 250 fluids.

## 2023-12-03 ENCOUNTER — Observation Stay (HOSPITAL_COMMUNITY)

## 2023-12-03 DIAGNOSIS — I5032 Chronic diastolic (congestive) heart failure: Secondary | ICD-10-CM

## 2023-12-03 DIAGNOSIS — D696 Thrombocytopenia, unspecified: Secondary | ICD-10-CM

## 2023-12-03 DIAGNOSIS — R55 Syncope and collapse: Secondary | ICD-10-CM

## 2023-12-03 DIAGNOSIS — J810 Acute pulmonary edema: Secondary | ICD-10-CM

## 2023-12-03 DIAGNOSIS — I2489 Other forms of acute ischemic heart disease: Secondary | ICD-10-CM | POA: Diagnosis not present

## 2023-12-03 DIAGNOSIS — R7989 Other specified abnormal findings of blood chemistry: Secondary | ICD-10-CM | POA: Diagnosis not present

## 2023-12-03 DIAGNOSIS — I4819 Other persistent atrial fibrillation: Secondary | ICD-10-CM

## 2023-12-03 DIAGNOSIS — Z8673 Personal history of transient ischemic attack (TIA), and cerebral infarction without residual deficits: Secondary | ICD-10-CM

## 2023-12-03 LAB — CBC
HCT: 32.9 % — ABNORMAL LOW (ref 36.0–46.0)
Hemoglobin: 10.8 g/dL — ABNORMAL LOW (ref 12.0–15.0)
MCH: 31.4 pg (ref 26.0–34.0)
MCHC: 32.8 g/dL (ref 30.0–36.0)
MCV: 95.6 fL (ref 80.0–100.0)
Platelets: 122 K/uL — ABNORMAL LOW (ref 150–400)
RBC: 3.44 MIL/uL — ABNORMAL LOW (ref 3.87–5.11)
RDW: 15 % (ref 11.5–15.5)
WBC: 7.1 K/uL (ref 4.0–10.5)
nRBC: 0 % (ref 0.0–0.2)

## 2023-12-03 LAB — ECHOCARDIOGRAM COMPLETE
AR max vel: 1.79 cm2
AV Area VTI: 1.52 cm2
AV Area mean vel: 1.83 cm2
AV Mean grad: 3 mmHg
AV Peak grad: 5.3 mmHg
Ao pk vel: 1.15 m/s
Area-P 1/2: 5.58 cm2
Calc EF: 41 %
Height: 59 in
S' Lateral: 3.6 cm
Single Plane A2C EF: 41.7 %
Single Plane A4C EF: 39.9 %

## 2023-12-03 LAB — BASIC METABOLIC PANEL WITH GFR
Anion gap: 10 (ref 5–15)
BUN: 11 mg/dL (ref 8–23)
CO2: 25 mmol/L (ref 22–32)
Calcium: 8.5 mg/dL — ABNORMAL LOW (ref 8.9–10.3)
Chloride: 99 mmol/L (ref 98–111)
Creatinine, Ser: 0.6 mg/dL (ref 0.44–1.00)
GFR, Estimated: >60 mL/min (ref >60)
Glucose, Bld: 119 mg/dL — ABNORMAL HIGH (ref 70–99)
Potassium: 3.5 mmol/L (ref 3.5–5.1)
Sodium: 134 mmol/L — ABNORMAL LOW (ref 135–145)

## 2023-12-03 LAB — RETICULOCYTES
Immature Retic Fract: 14.8 % (ref 2.3–15.9)
RBC.: 3.62 MIL/uL — ABNORMAL LOW (ref 3.87–5.11)
Retic Count, Absolute: 66.5 K/uL (ref 19.0–186.0)
Retic Ct Pct: 1.9 % (ref 0.4–3.1)

## 2023-12-03 LAB — IRON AND TIBC
Iron: 30 ug/dL (ref 28–170)
Saturation Ratios: 10 % — ABNORMAL LOW (ref 10.4–31.8)
TIBC: 304 ug/dL (ref 250–450)
UIBC: 274 ug/dL

## 2023-12-03 LAB — VITAMIN B12: Vitamin B-12: 399 pg/mL (ref 180–914)

## 2023-12-03 LAB — FOLATE: Folate: >20 ng/mL (ref >5.9)

## 2023-12-03 LAB — FERRITIN: Ferritin: 275 ng/mL (ref 11–307)

## 2023-12-03 MED ORDER — METOPROLOL TARTRATE 12.5 MG HALF TABLET: 12.5000 mg | ORAL_TABLET | Freq: Two times a day (BID) | ORAL | Status: DC

## 2023-12-03 MED ORDER — APIXABAN 5 MG PO TABS: 5.0000 mg | ORAL_TABLET | Freq: Two times a day (BID) | ORAL | 1 refills | Status: AC

## 2023-12-03 MED ORDER — LOSARTAN POTASSIUM 25 MG PO TABS: 12.5000 mg | ORAL_TABLET | Freq: Every day | ORAL | Status: DC

## 2023-12-03 MED ORDER — METOPROLOL TARTRATE 25 MG PO TABS: 12.5000 mg | ORAL_TABLET | Freq: Two times a day (BID) | ORAL | 1 refills | Status: AC

## 2023-12-03 MED ORDER — FUROSEMIDE 20 MG PO TABS
20.0000 mg | ORAL_TABLET | Freq: Once | ORAL | Status: AC
  Administered 2023-12-03: 20 mg via ORAL
  Filled 2023-12-03: qty 1

## 2023-12-03 MED ORDER — APIXABAN 5 MG PO TABS: 5.0000 mg | ORAL_TABLET | Freq: Two times a day (BID) | ORAL | Status: DC

## 2023-12-03 NOTE — Plan of Care (Signed)

## 2023-12-03 NOTE — Evaluation (Signed)
 Physical Therapy Evaluation Patient Details Name: Carmen Hammond MRN: [id number] DOB: 05/25/1928 Today's Date: 12/03/2023  History of Present Illness  Patient is a 89 y/o female admitted 12/02/23 due to syncopal episode at home and fall in the bathroom.  Patient with forehead hematoma and admitted for workup.   PMH positive for h/o SVT s/p ablation with recurrences, persistent a-fib controlled on metoprolol , HTN, h/o CVA 2023.  Clinical Impression  Patient presents with decreased mobility due to decreased balance, decreased activity tolerance and decreased strength.  Previously getting help with RW to get to bathroom.  Family is supportive and can continue providing assistance at home.  Patient currently min to CGA for mobility up to bathroom limited by knee pain and fatigue though vitals stable as noted below.  Will continue to follow while admitted and recommend HHPT at d/c.    Orthostatic VS for the past 24 hrs (Last 3 readings):  BP- Lying Pulse- Lying BP- Sitting Pulse- Sitting BP- Standing at 0 minutes Pulse- Standing at 0 minutes  12/03/23 1200 124/63 62 (!) 130/97 62 121/70 66         If plan is discharge home, recommend the following: A little help with walking and/or transfers;A little help with bathing/dressing/bathroom;Help with stairs or ramp for entrance;Assist for transportation   Can travel by private vehicle        Equipment Recommendations None recommended by PT  Recommendations for Other Services       Functional Status Assessment Patient has had a recent decline in their functional status and demonstrates the ability to make significant improvements in function in a reasonable and predictable amount of time.     Precautions / Restrictions Precautions Precautions: Fall Recall of Precautions/Restrictions: Impaired      Mobility  Bed Mobility Overal bed mobility: Needs Assistance Bed Mobility: Supine to Sit, Sit to Supine     Supine to sit: Mod assist, Used  rails Sit to supine: Min assist   General bed mobility comments: increased time, assist for legs off EOB and scooting hips, to supine with A for positioning, cues and time for scooting to middle of bed    Transfers Overall transfer level: Needs assistance Equipment used: Rolling walker (2 wheels) Transfers: Sit to/from Stand Sit to Stand: Contact guard assist, Min assist           General transfer comment: initially min A for balance, up from toilet with rails and walker with CGA for balance    Ambulation/Gait Ambulation/Gait assistance: Contact guard assist Gait Distance (Feet): 15 Feet (x 2) Assistive device: Rolling walker (2 wheels) Gait Pattern/deviations: Step-to pattern, Decreased stride length, Shuffle, Trunk flexed       General Gait Details: slow pace, assist for walker at times, pt holding front of walker and rail under grip on side, A for balance  Stairs            Wheelchair Mobility     Tilt Bed    Modified Rankin (Stroke Patients Only)       Balance Overall balance assessment: Needs assistance   Sitting balance-Leahy Scale: Good     Standing balance support: Single extremity supported, Bilateral upper extremity supported, During functional activity, Reliant on assistive device for balance Standing balance-Leahy Scale: Poor                               Pertinent Vitals/Pain Pain Assessment Pain Assessment: Faces Faces Pain Scale:  Hurts little more Pain Location: knees with mobility Pain Descriptors / Indicators: Sore Pain Intervention(s): Monitored during session, Limited activity within patient's tolerance    Home Living Family/patient expects to be discharged to:: Private residence Living Arrangements: Children Available Help at Discharge: Family;Available 24 hours/day Type of Home: House Home Access: Stairs to enter Entrance Stairs-Rails: None Entrance Stairs-Number of Steps: 2 Alternate Level Stairs-Number of  Steps: 15 Home Layout: 1/2 bath on main level;Two level Home Equipment: Pharmacist, hospital (2 wheels);BSC/3in1;Wheelchair - manual Additional Comments: was going upstairs to shower with son help but not past week due to pain in hips    Prior Function Prior Level of Function : Independent/Modified Independent             Mobility Comments: walks with RW but has to have help to go to bathroom, fell in bathroom last night       Extremity/Trunk Assessment   Upper Extremity Assessment Upper Extremity Assessment: Generalized weakness    Lower Extremity Assessment Lower Extremity Assessment: Generalized weakness    Cervical / Trunk Assessment Cervical / Trunk Assessment: Kyphotic  Communication   Communication Communication: No apparent difficulties    Cognition Arousal: Alert Behavior During Therapy: WFL for tasks assessed/performed   PT - Cognitive impairments: History of cognitive impairments                         Following commands: Intact, Impaired Following commands impaired: Follows one step commands with increased time     Cueing Cueing Techniques: Verbal cues     General Comments General comments (skin integrity, edema, etc.): BP stable with supine to sit, see flowsheet; pt soiled with urine in bed, assisted for hygiene, gown change and NT in to change linens while pt in bathroom; family present and supportive    Exercises     Assessment/Plan    PT Assessment Patient needs continued PT services  PT Problem List Decreased activity tolerance;Decreased balance;Decreased mobility;Decreased strength       PT Treatment Interventions DME instruction;Gait training;Patient/family education;Functional mobility training;Therapeutic activities;Therapeutic exercise;Balance training;Stair training    PT Goals (Current goals can be found in the Care Plan section)  Acute Rehab PT Goals Patient Stated Goal: return home PT Goal Formulation: With  family Time For Goal Achievement: 12/17/23 Potential to Achieve Goals: Good    Frequency Min 2X/week     Co-evaluation               AM-PAC PT 6 Clicks Mobility  Outcome Measure Help needed turning from your back to your side while in a flat bed without using bedrails?: A Little Help needed moving from lying on your back to sitting on the side of a flat bed without using bedrails?: A Lot Help needed moving to and from a bed to a chair (including a wheelchair)?: A Little Help needed standing up from a chair using your arms (e.g., wheelchair or bedside chair)?: A Little Help needed to walk in hospital room?: A Little Help needed climbing 3-5 steps with a railing? : A Lot 6 Click Score: 16    End of Session Equipment Utilized During Treatment: Gait belt Activity Tolerance: Patient limited by fatigue Patient left: in bed;with family/visitor present;with call bell/phone within reach   PT Visit Diagnosis: Other abnormalities of gait and mobility (R26.89);Difficulty in walking, not elsewhere classified (R26.2)    Time: 9046-8971 PT Time Calculation (min) (ACUTE ONLY): 35 min   Charges:   PT Evaluation $  PT Eval Moderate Complexity: 1 Mod PT Treatments $Gait Training: 8-22 mins PT General Charges $$ ACUTE PT VISIT: 1 Visit         Micheline Portal, PT Acute Rehabilitation Services Office:[phone] 12/03/2023   Montie Portal 12/03/2023, 12:29 PM

## 2023-12-03 NOTE — Progress Notes (Signed)
 DC order noted per MD. DC RN at bedside. Patient daughter present at bedside informed/agreeable with plan for discharge, willing to transport patient home with assistance of family support to transport the patient out of her car on arrival to patient home. PIV removed. AVS printed/reviewed. Home health coordinated with [facility] per CM. Patient dressed, all belongings accounted for. No home/TOC meds. Patient wheeled downstairs and successfully transferred into private auto with daughter.

## 2023-12-03 NOTE — Discharge Summary (Signed)
 Physician Discharge Summary   Patient: CLEASTER SHIFFER MRN: [id number] DOB: [birth year]  Admit date:     12/02/2023  Discharge date: {dischdate:26783}  Discharge Physician: Concepcion Riser   PCP: Sophronia Ozell BROCKS, MD   Recommendations at discharge:  {Tip this will not be part of the note when signed- Example include specific recommendations for outpatient follow-up, pending tests to follow-up on. (Optional):26781}  PCP follow up in 1 week.  Discharge Diagnoses: Principal Problem:   Syncope and collapse Active Problems:   Stroke (HCC)   HTN (hypertension)   Hypothyroidism   Chronic diastolic CHF (congestive heart failure) (HCC)   PAF (paroxysmal atrial fibrillation) (HCC)   Anemia   Thrombocytopenia (HCC)   Persistent atrial fibrillation (HCC)   Elevated brain natriuretic peptide (BNP) level   Demand ischemia (HCC)   Acute pulmonary edema (HCC)  Resolved Problems:   * No resolved hospital problems. [facility] Course: No notes on file  Assessment and Plan: No notes have been filed under this hospital service. Service: Hospitalist     {Tip this will not be part of the note when signed Body mass index is 30.09 kg/m. , ,  (Optional):26781}  {(NOTE) Pain control PDMP Statment (Optional):26782} Consultants: Cardiology Procedures performed: none  Disposition: Home health Diet recommendation:  Discharge Diet Orders (From admission, onward)     Start     Ordered   12/03/23 0000  Diet - low sodium heart healthy        12/03/23 1510           Cardiac diet DISCHARGE MEDICATION: Allergies as of 12/03/2023       Reactions   Meperidine Hcl Nausea And Vomiting   Nitrofurantoin Nausea And Vomiting   Oxycodone-acetaminophen  Other (See Comments)   confused   Sulfa Antibiotics Nausea And Vomiting        Medication List     STOP taking these medications    aspirin  325 MG tablet   clopidogrel  75 MG tablet Commonly known as: PLAVIX    diltiazem 120 MG  24 hr capsule Commonly known as: CARDIZEM CD   magnesium  30 MG tablet       TAKE these medications    apixaban  5 MG Tabs tablet Commonly known as: ELIQUIS  Take 1 tablet (5 mg total) by mouth 2 (two) times daily.   atorvastatin  20 MG tablet Commonly known as: LIPITOR  Take 1 tablet (20 mg total) by mouth at bedtime.   Biotin  5000 MCG Tabs Take 5,000 mcg by mouth in the morning.   cholecalciferol 25 MCG (1000 UNIT) tablet Commonly known as: VITAMIN D3 Take 1,000 Units by mouth daily with lunch.   levothyroxine  25 MCG tablet Commonly known as: SYNTHROID  Take 25 mcg by mouth daily before breakfast.   metoprolol  tartrate 25 MG tablet Commonly known as: LOPRESSOR  Take 0.5 tablets (12.5 mg total) by mouth 2 (two) times daily. What changed:  medication strength how much to take   multivitamin with minerals Tabs tablet Take 1 tablet by mouth daily with lunch.   traMADol  50 MG tablet Commonly known as: Ultram  Take 1 tablet (50 mg total) by mouth every 6 (six) hours as needed. What changed: reasons to take this        Discharge Exam:    12/03/2023   11:47 AM 12/03/2023    8:23 AM 12/03/2023    5:38 AM  Vitals with BMI  Systolic 122 121 861  Diastolic 62 58 79  Pulse 62 70 76   General -  Elderly/  Middle aged/ Young  *** female/ female, no apparent ***distress HEENT - PERRLA, EOMI, atraumatic head, non tender sinuses. Lung - Clear, ***rales, rhonchi, wheezes. Heart - S1, S2 heard, no murmurs, rubs, *** pedal edema. Abdomen - Soft, non tender ***, bowel sounds *** Neuro - Alert, awake and oriented x ***, non focal exam. Skin - Warm and dry.  Condition at discharge: stable  The results of significant diagnostics from this hospitalization (including imaging, microbiology, ancillary and laboratory) are listed below for reference.   Imaging Studies: ECHOCARDIOGRAM COMPLETE Result Date: 12/03/2023    ECHOCARDIOGRAM REPORT   Patient Name:   GUILLERMINA SHAFT Date of Exam:  12/03/2023 Medical Rec #:  [id number]        Height:       59.0 in Accession #:    [id number]       Weight:       149.0 lb Date of Birth:  [birth year]        BSA:          1.627 m Patient Age:    89 years         BP:           122/62 mmHg Patient Gender: F                HR:           62 bpm. Exam Location:  Inpatient Procedure: 2D Echo, Cardiac Doppler and Color Doppler (Both Spectral and Color            Flow Doppler were utilized during procedure). Indications:    Syncope  History:        Patient has prior history of Echocardiogram examinations, most                 recent 05/25/2021. CHF, Stroke, Signs/Symptoms:Syncope; Risk                 Factors:Hypertension.  Sonographer:    Therisa Crouch Referring Phys: [id number] ZANE ADAMS IMPRESSIONS  1. Left ventricular ejection fraction, by estimation, is 40 to 45%. The left ventricle has mildly decreased function. The left ventricle demonstrates global hypokinesis. Left ventricular diastolic parameters are indeterminate.  2. Right ventricular systolic function is mildly reduced. The right ventricular size is normal. There is mildly elevated pulmonary artery systolic pressure. The estimated right ventricular systolic pressure is 40.9 mmHg.  3. Left atrial size was mildly dilated.  4. Right atrial size was mildly dilated.  5. The mitral valve is normal in structure. Trivial mitral valve regurgitation. No evidence of mitral stenosis.  6. The aortic valve is tricuspid. Aortic valve regurgitation is trivial. Aortic valve sclerosis/calcification is present, without any evidence of aortic stenosis.  7. The inferior vena cava is normal in size with <50% respiratory variability, suggesting right atrial pressure of 8 mmHg. FINDINGS  Left Ventricle: Left ventricular ejection fraction, by estimation, is 40 to 45%. The left ventricle has mildly decreased function. The left ventricle demonstrates global hypokinesis. The left ventricular internal cavity size was normal in size. There is  no  left ventricular hypertrophy. Left ventricular diastolic parameters are indeterminate. Right Ventricle: The right ventricular size is normal. No increase in right ventricular wall thickness. Right ventricular systolic function is mildly reduced. There is mildly elevated pulmonary artery systolic pressure. The tricuspid regurgitant velocity  is 2.87 m/s, and with an assumed right atrial pressure of 8 mmHg, the estimated right ventricular systolic pressure is 40.9 mmHg. Left Atrium: Left  atrial size was mildly dilated. Right Atrium: Right atrial size was mildly dilated. Pericardium: There is no evidence of pericardial effusion. Mitral Valve: The mitral valve is normal in structure. Trivial mitral valve regurgitation. No evidence of mitral valve stenosis. Tricuspid Valve: The tricuspid valve is normal in structure. Tricuspid valve regurgitation is trivial. Aortic Valve: The aortic valve is tricuspid. Aortic valve regurgitation is trivial. Aortic valve sclerosis/calcification is present, without any evidence of aortic stenosis. Aortic valve mean gradient measures 3.0 mmHg. Aortic valve peak gradient measures 5.3 mmHg. Aortic valve area, by VTI measures 1.52 cm. Pulmonic Valve: The pulmonic valve was not well visualized. Pulmonic valve regurgitation is trivial. Aorta: The aortic root and ascending aorta are structurally normal, with no evidence of dilitation. Venous: The inferior vena cava is normal in size with less than 50% respiratory variability, suggesting right atrial pressure of 8 mmHg. IAS/Shunts: The atrial septum is grossly normal.  LEFT VENTRICLE PLAX 2D LVIDd:         4.30 cm     Diastology LVIDs:         3.60 cm     LV e' medial:    6.53 cm/s LV PW:         0.90 cm     LV E/e' medial:  10.4 LV IVS:        0.70 cm     LV e' lateral:   7.93 cm/s LVOT diam:     1.80 cm     LV E/e' lateral: 8.6 LV SV:         38 LV SV Index:   23 LVOT Area:     2.54 cm  LV Volumes (MOD) LV vol d, MOD A2C: 55.4 ml LV vol d, MOD  A4C: 44.1 ml LV vol s, MOD A2C: 32.3 ml LV vol s, MOD A4C: 26.5 ml LV SV MOD A2C:     23.1 ml LV SV MOD A4C:     44.1 ml LV SV MOD BP:      21.3 ml RIGHT VENTRICLE          IVC RV Basal diam:  3.50 cm  IVC diam: 1.90 cm TAPSE (M-mode): 1.3 cm LEFT ATRIUM             Index LA diam:        3.90 cm 2.40 cm/m LA Vol (A2C):   53.0 ml 32.57 ml/m LA Vol (A4C):   53.8 ml 33.06 ml/m LA Biplane Vol: 61.0 ml 37.48 ml/m  AORTIC VALVE AV Area (Vmax):    1.79 cm AV Area (Vmean):   1.83 cm AV Area (VTI):     1.52 cm AV Vmax:           115.00 cm/s AV Vmean:          74.800 cm/s AV VTI:            0.251 m AV Peak Grad:      5.3 mmHg AV Mean Grad:      3.0 mmHg LVOT Vmax:         80.90 cm/s LVOT Vmean:        53.700 cm/s LVOT VTI:          0.150 m LVOT/AV VTI ratio: 0.60  AORTA Ao Root diam: 2.50 cm Ao Asc diam:  3.10 cm MITRAL VALVE               TRICUSPID VALVE MV Area (PHT): 5.58 cm    TR Peak grad:  32.9 mmHg MV Decel Time: 136 msec    TR Vmax:        287.00 cm/s MV E velocity: 68.10 cm/s MV A velocity: 29.50 cm/s  SHUNTS MV E/A ratio:  2.31        Systemic VTI:  0.15 m                            Systemic Diam: 1.80 cm Lonni Nanas MD Electronically signed by Lonni Nanas MD Signature Date/Time: 12/03/2023/3:05:38 PM    Final    CT HEAD WO CONTRAST Result Date: 12/02/2023 CLINICAL DATA:  Trauma, fall EXAM: CT HEAD, FACIAL BONES AND CERVICAL SPINE WITHOUT CONTRAST CT CHEST, ABDOMEN, AND PELVIS WITH CONTRAST TECHNIQUE: Multi detector CT imaging of the head, facial bones, and cervical spine was performed without intravenous contrast. Multidetector CT imaging of the chest, abdomen and pelvis was performed following the standard protocol during bolus administration of intravenous contrast. RADIATION DOSE REDUCTION: This exam was performed according to the departmental dose-optimization program which includes automated exposure control, adjustment of the mA and/or kV according to patient size and/or use of  iterative reconstruction technique. CONTRAST:  55mL OMNIPAQUE  IOHEXOL  350 MG/ML SOLN COMPARISON:  10/31/2021 FINDINGS: CT HEAD FINDINGS Brain: No evidence of acute infarction, hemorrhage, hydrocephalus, extra-axial collection or mass lesion/mass effect. Right anterior MCA territory encephalomalacia. Vascular: No hyperdense vessel or unexpected calcification. CT FACIAL BONES FINDINGS Skull: Normal. Negative for fracture or focal lesion. Facial bones: No displaced fractures or dislocations. Sinuses/Orbits: No acute finding. Other: Dense cement and/or coil material in the left suboccipital soft tissues. CT CERVICAL SPINE FINDINGS Alignment: Degenerative straightening of the normal cervical lordosis. Degenerative anterolisthesis of C3 on C4. Skull base and vertebrae: No acute fracture. No primary bone lesion or focal pathologic process. Soft tissues and spinal canal: No prevertebral fluid or swelling. No visible canal hematoma. Disc levels: Moderate multilevel cervical disc degenerative disease, worst from C4-C7. Other: None. CT CHEST FINDINGS Cardiovascular: Aortic atherosclerosis. Cardiomegaly. Enlargement of the main pulmonary artery measuring up to 3.8 cm in caliber. No pericardial effusion. Mediastinum/Nodes: No enlarged mediastinal, hilar, or axillary lymph nodes. Thyroid  gland, trachea, and esophagus demonstrate no significant findings. Lungs/Pleura: Trace pleural effusions and associated interlobular septal thickening. Dependent bibasilar scarring or atelectasis. Musculoskeletal: No chest wall abnormality. No acute osseous findings. CT ABDOMEN PELVIS FINDINGS Hepatobiliary: No solid liver abnormality is seen. No gallstones, gallbladder wall thickening, or biliary dilatation. Pancreas: Unremarkable. No pancreatic ductal dilatation or surrounding inflammatory changes. Spleen: Normal in size without significant abnormality. Adrenals/Urinary Tract: Adrenal glands are unremarkable. Kidneys are normal, without renal  calculi, solid lesion, or hydronephrosis. Bladder is unremarkable. Stomach/Bowel: Stomach is within normal limits. Appendix appears normal. No evidence of bowel wall thickening, distention, or inflammatory changes. Vascular/Lymphatic: Aortic atherosclerosis. No enlarged abdominal or pelvic lymph nodes. Reproductive: Calcified uterine fibroids. Air within the fundal endometrial cavity. Other: No abdominal wall hernia or abnormality. No ascites. Musculoskeletal: No acute osseous findings. Status post bilateral hip total arthroplasty with dense metallic streak artifact. IMPRESSION: 1. No acute intracranial pathology. Right anterior MCA territory encephalomalacia. 2. No displaced fractures or dislocations of the facial bones. 3. No fracture or static subluxation of the cervical spine. 4. No CT evidence of acute traumatic injury to the chest, abdomen, or pelvis. 5. Cardiomegaly. Trace pleural effusions and associated interlobular septal thickening, consistent with mild pulmonary edema. 6. Enlargement of the main pulmonary artery, as can be seen in pulmonary hypertension. 7. Calcified  uterine fibroids. Air within the fundal endometrial cavity, of uncertain significance, possibly related to prior instrumentation or surgery although of doubtful acute clinical significance. Aortic Atherosclerosis (ICD10-I70.0). Electronically Signed   By: Marolyn JONETTA Jaksch M.D.   On: 12/02/2023 21:35   CT CERVICAL SPINE WO CONTRAST Result Date: 12/02/2023 CLINICAL DATA:  Trauma, fall EXAM: CT HEAD, FACIAL BONES AND CERVICAL SPINE WITHOUT CONTRAST CT CHEST, ABDOMEN, AND PELVIS WITH CONTRAST TECHNIQUE: Multi detector CT imaging of the head, facial bones, and cervical spine was performed without intravenous contrast. Multidetector CT imaging of the chest, abdomen and pelvis was performed following the standard protocol during bolus administration of intravenous contrast. RADIATION DOSE REDUCTION: This exam was performed according to the  departmental dose-optimization program which includes automated exposure control, adjustment of the mA and/or kV according to patient size and/or use of iterative reconstruction technique. CONTRAST:  55mL OMNIPAQUE  IOHEXOL  350 MG/ML SOLN COMPARISON:  10/31/2021 FINDINGS: CT HEAD FINDINGS Brain: No evidence of acute infarction, hemorrhage, hydrocephalus, extra-axial collection or mass lesion/mass effect. Right anterior MCA territory encephalomalacia. Vascular: No hyperdense vessel or unexpected calcification. CT FACIAL BONES FINDINGS Skull: Normal. Negative for fracture or focal lesion. Facial bones: No displaced fractures or dislocations. Sinuses/Orbits: No acute finding. Other: Dense cement and/or coil material in the left suboccipital soft tissues. CT CERVICAL SPINE FINDINGS Alignment: Degenerative straightening of the normal cervical lordosis. Degenerative anterolisthesis of C3 on C4. Skull base and vertebrae: No acute fracture. No primary bone lesion or focal pathologic process. Soft tissues and spinal canal: No prevertebral fluid or swelling. No visible canal hematoma. Disc levels: Moderate multilevel cervical disc degenerative disease, worst from C4-C7. Other: None. CT CHEST FINDINGS Cardiovascular: Aortic atherosclerosis. Cardiomegaly. Enlargement of the main pulmonary artery measuring up to 3.8 cm in caliber. No pericardial effusion. Mediastinum/Nodes: No enlarged mediastinal, hilar, or axillary lymph nodes. Thyroid  gland, trachea, and esophagus demonstrate no significant findings. Lungs/Pleura: Trace pleural effusions and associated interlobular septal thickening. Dependent bibasilar scarring or atelectasis. Musculoskeletal: No chest wall abnormality. No acute osseous findings. CT ABDOMEN PELVIS FINDINGS Hepatobiliary: No solid liver abnormality is seen. No gallstones, gallbladder wall thickening, or biliary dilatation. Pancreas: Unremarkable. No pancreatic ductal dilatation or surrounding inflammatory  changes. Spleen: Normal in size without significant abnormality. Adrenals/Urinary Tract: Adrenal glands are unremarkable. Kidneys are normal, without renal calculi, solid lesion, or hydronephrosis. Bladder is unremarkable. Stomach/Bowel: Stomach is within normal limits. Appendix appears normal. No evidence of bowel wall thickening, distention, or inflammatory changes. Vascular/Lymphatic: Aortic atherosclerosis. No enlarged abdominal or pelvic lymph nodes. Reproductive: Calcified uterine fibroids. Air within the fundal endometrial cavity. Other: No abdominal wall hernia or abnormality. No ascites. Musculoskeletal: No acute osseous findings. Status post bilateral hip total arthroplasty with dense metallic streak artifact. IMPRESSION: 1. No acute intracranial pathology. Right anterior MCA territory encephalomalacia. 2. No displaced fractures or dislocations of the facial bones. 3. No fracture or static subluxation of the cervical spine. 4. No CT evidence of acute traumatic injury to the chest, abdomen, or pelvis. 5. Cardiomegaly. Trace pleural effusions and associated interlobular septal thickening, consistent with mild pulmonary edema. 6. Enlargement of the main pulmonary artery, as can be seen in pulmonary hypertension. 7. Calcified uterine fibroids. Air within the fundal endometrial cavity, of uncertain significance, possibly related to prior instrumentation or surgery although of doubtful acute clinical significance. Aortic Atherosclerosis (ICD10-I70.0). Electronically Signed   By: Marolyn JONETTA Jaksch M.D.   On: 12/02/2023 21:35   CT CHEST ABDOMEN PELVIS W CONTRAST Result Date: 12/02/2023 CLINICAL DATA:  Trauma, fall  EXAM: CT HEAD, FACIAL BONES AND CERVICAL SPINE WITHOUT CONTRAST CT CHEST, ABDOMEN, AND PELVIS WITH CONTRAST TECHNIQUE: Multi detector CT imaging of the head, facial bones, and cervical spine was performed without intravenous contrast. Multidetector CT imaging of the chest, abdomen and pelvis was performed  following the standard protocol during bolus administration of intravenous contrast. RADIATION DOSE REDUCTION: This exam was performed according to the departmental dose-optimization program which includes automated exposure control, adjustment of the mA and/or kV according to patient size and/or use of iterative reconstruction technique. CONTRAST:  55mL OMNIPAQUE  IOHEXOL  350 MG/ML SOLN COMPARISON:  10/31/2021 FINDINGS: CT HEAD FINDINGS Brain: No evidence of acute infarction, hemorrhage, hydrocephalus, extra-axial collection or mass lesion/mass effect. Right anterior MCA territory encephalomalacia. Vascular: No hyperdense vessel or unexpected calcification. CT FACIAL BONES FINDINGS Skull: Normal. Negative for fracture or focal lesion. Facial bones: No displaced fractures or dislocations. Sinuses/Orbits: No acute finding. Other: Dense cement and/or coil material in the left suboccipital soft tissues. CT CERVICAL SPINE FINDINGS Alignment: Degenerative straightening of the normal cervical lordosis. Degenerative anterolisthesis of C3 on C4. Skull base and vertebrae: No acute fracture. No primary bone lesion or focal pathologic process. Soft tissues and spinal canal: No prevertebral fluid or swelling. No visible canal hematoma. Disc levels: Moderate multilevel cervical disc degenerative disease, worst from C4-C7. Other: None. CT CHEST FINDINGS Cardiovascular: Aortic atherosclerosis. Cardiomegaly. Enlargement of the main pulmonary artery measuring up to 3.8 cm in caliber. No pericardial effusion. Mediastinum/Nodes: No enlarged mediastinal, hilar, or axillary lymph nodes. Thyroid  gland, trachea, and esophagus demonstrate no significant findings. Lungs/Pleura: Trace pleural effusions and associated interlobular septal thickening. Dependent bibasilar scarring or atelectasis. Musculoskeletal: No chest wall abnormality. No acute osseous findings. CT ABDOMEN PELVIS FINDINGS Hepatobiliary: No solid liver abnormality is seen. No  gallstones, gallbladder wall thickening, or biliary dilatation. Pancreas: Unremarkable. No pancreatic ductal dilatation or surrounding inflammatory changes. Spleen: Normal in size without significant abnormality. Adrenals/Urinary Tract: Adrenal glands are unremarkable. Kidneys are normal, without renal calculi, solid lesion, or hydronephrosis. Bladder is unremarkable. Stomach/Bowel: Stomach is within normal limits. Appendix appears normal. No evidence of bowel wall thickening, distention, or inflammatory changes. Vascular/Lymphatic: Aortic atherosclerosis. No enlarged abdominal or pelvic lymph nodes. Reproductive: Calcified uterine fibroids. Air within the fundal endometrial cavity. Other: No abdominal wall hernia or abnormality. No ascites. Musculoskeletal: No acute osseous findings. Status post bilateral hip total arthroplasty with dense metallic streak artifact. IMPRESSION: 1. No acute intracranial pathology. Right anterior MCA territory encephalomalacia. 2. No displaced fractures or dislocations of the facial bones. 3. No fracture or static subluxation of the cervical spine. 4. No CT evidence of acute traumatic injury to the chest, abdomen, or pelvis. 5. Cardiomegaly. Trace pleural effusions and associated interlobular septal thickening, consistent with mild pulmonary edema. 6. Enlargement of the main pulmonary artery, as can be seen in pulmonary hypertension. 7. Calcified uterine fibroids. Air within the fundal endometrial cavity, of uncertain significance, possibly related to prior instrumentation or surgery although of doubtful acute clinical significance. Aortic Atherosclerosis (ICD10-I70.0). Electronically Signed   By: Marolyn JONETTA Jaksch M.D.   On: 12/02/2023 21:35   CT Maxillofacial Wo Contrast Result Date: 12/02/2023 CLINICAL DATA:  Trauma, fall EXAM: CT HEAD, FACIAL BONES AND CERVICAL SPINE WITHOUT CONTRAST CT CHEST, ABDOMEN, AND PELVIS WITH CONTRAST TECHNIQUE: Multi detector CT imaging of the head, facial  bones, and cervical spine was performed without intravenous contrast. Multidetector CT imaging of the chest, abdomen and pelvis was performed following the standard protocol during bolus administration of intravenous contrast. RADIATION  DOSE REDUCTION: This exam was performed according to the departmental dose-optimization program which includes automated exposure control, adjustment of the mA and/or kV according to patient size and/or use of iterative reconstruction technique. CONTRAST:  55mL OMNIPAQUE  IOHEXOL  350 MG/ML SOLN COMPARISON:  10/31/2021 FINDINGS: CT HEAD FINDINGS Brain: No evidence of acute infarction, hemorrhage, hydrocephalus, extra-axial collection or mass lesion/mass effect. Right anterior MCA territory encephalomalacia. Vascular: No hyperdense vessel or unexpected calcification. CT FACIAL BONES FINDINGS Skull: Normal. Negative for fracture or focal lesion. Facial bones: No displaced fractures or dislocations. Sinuses/Orbits: No acute finding. Other: Dense cement and/or coil material in the left suboccipital soft tissues. CT CERVICAL SPINE FINDINGS Alignment: Degenerative straightening of the normal cervical lordosis. Degenerative anterolisthesis of C3 on C4. Skull base and vertebrae: No acute fracture. No primary bone lesion or focal pathologic process. Soft tissues and spinal canal: No prevertebral fluid or swelling. No visible canal hematoma. Disc levels: Moderate multilevel cervical disc degenerative disease, worst from C4-C7. Other: None. CT CHEST FINDINGS Cardiovascular: Aortic atherosclerosis. Cardiomegaly. Enlargement of the main pulmonary artery measuring up to 3.8 cm in caliber. No pericardial effusion. Mediastinum/Nodes: No enlarged mediastinal, hilar, or axillary lymph nodes. Thyroid  gland, trachea, and esophagus demonstrate no significant findings. Lungs/Pleura: Trace pleural effusions and associated interlobular septal thickening. Dependent bibasilar scarring or atelectasis.  Musculoskeletal: No chest wall abnormality. No acute osseous findings. CT ABDOMEN PELVIS FINDINGS Hepatobiliary: No solid liver abnormality is seen. No gallstones, gallbladder wall thickening, or biliary dilatation. Pancreas: Unremarkable. No pancreatic ductal dilatation or surrounding inflammatory changes. Spleen: Normal in size without significant abnormality. Adrenals/Urinary Tract: Adrenal glands are unremarkable. Kidneys are normal, without renal calculi, solid lesion, or hydronephrosis. Bladder is unremarkable. Stomach/Bowel: Stomach is within normal limits. Appendix appears normal. No evidence of bowel wall thickening, distention, or inflammatory changes. Vascular/Lymphatic: Aortic atherosclerosis. No enlarged abdominal or pelvic lymph nodes. Reproductive: Calcified uterine fibroids. Air within the fundal endometrial cavity. Other: No abdominal wall hernia or abnormality. No ascites. Musculoskeletal: No acute osseous findings. Status post bilateral hip total arthroplasty with dense metallic streak artifact. IMPRESSION: 1. No acute intracranial pathology. Right anterior MCA territory encephalomalacia. 2. No displaced fractures or dislocations of the facial bones. 3. No fracture or static subluxation of the cervical spine. 4. No CT evidence of acute traumatic injury to the chest, abdomen, or pelvis. 5. Cardiomegaly. Trace pleural effusions and associated interlobular septal thickening, consistent with mild pulmonary edema. 6. Enlargement of the main pulmonary artery, as can be seen in pulmonary hypertension. 7. Calcified uterine fibroids. Air within the fundal endometrial cavity, of uncertain significance, possibly related to prior instrumentation or surgery although of doubtful acute clinical significance. Aortic Atherosclerosis (ICD10-I70.0). Electronically Signed   By: Marolyn JONETTA Jaksch M.D.   On: 12/02/2023 21:35   DG Chest Port 1 View Result Date: 12/02/2023 CLINICAL DATA:  Trauma EXAM: PORTABLE CHEST - 1  VIEW COMPARISON:  January 06, 2006 FINDINGS: Lower lung volumes with bronchovascular crowding. No focal airspace consolidation, pleural effusion, or pneumothorax. Mild cardiomegaly. Tortuous aorta with aortic atherosclerosis. No acute fracture or destructive lesions. Multilevel thoracic osteophytosis. IMPRESSION: Low lung volumes.  Otherwise, no acute cardiopulmonary abnormality. Electronically Signed   By: Rogelia Myers M.D.   On: 12/02/2023 20:29   DG Femur Portable Min 2 Views Right Result Date: 12/02/2023 CLINICAL DATA:  Left leg pain EXAM: RIGHT FEMUR PORTABLE 2 VIEW COMPARISON:  None Available. FINDINGS: Left hip arthroplasty appears anatomically aligned.No acute fracture or dislocation. No periprosthetic lucency to suggest loosening. Osteoarthritis of the knee.  Diffuse peripheral vascular atherosclerosis. IMPRESSION: No acute fracture or dislocation status post left hip arthroplasty. Electronically Signed   By: Rogelia Myers M.D.   On: 12/02/2023 20:28   DG Pelvis 1-2 Views Result Date: 12/02/2023 CLINICAL DATA:  [id number] Hip pain [id number] EXAM: PELVIS - 1-2 VIEW COMPARISON:  None Available. FINDINGS: Diffuse osteopenia. Bilateral hip arthroplasties appear anatomically aligned without dislocation.Cortical irregularity along the pelvic rim at the right puboacetabular junction.No acute hip fracture or dislocation.Lumbosacral fusion hardware at L5-S1 with disc replacement.Soft tissues are unremarkable. IMPRESSION: Diffuse osteopenia. Cortical irregularity along the pelvic rim at the right puboacetabular junction, worrisome for mildly displaced fracture. A follow-up pelvic CT is recommended for further characterization. Electronically Signed   By: Rogelia Myers M.D.   On: 12/02/2023 20:27    Microbiology: Results for orders placed or performed during the hospital encounter of 05/24/21  Resp Panel by RT-PCR (Flu A&B, Covid) Nasopharyngeal Swab     Status: None   Collection Time: 05/24/21 11:55 AM    Specimen: Nasopharyngeal Swab; Nasopharyngeal(NP) swabs in vial transport medium  Result Value Ref Range Status   SARS Coronavirus 2 by RT PCR NEGATIVE NEGATIVE Final    Comment: (NOTE) SARS-CoV-2 target nucleic acids are NOT DETECTED.  The SARS-CoV-2 RNA is generally detectable in upper respiratory specimens during the acute phase of infection. The lowest concentration of SARS-CoV-2 viral copies this assay can detect is 138 copies/mL. A negative result does not preclude SARS-Cov-2 infection and should not be used as the sole basis for treatment or other patient management decisions. A negative result may occur with  improper specimen collection/handling, submission of specimen other than nasopharyngeal swab, presence of viral mutation(s) within the areas targeted by this assay, and inadequate number of viral copies(<138 copies/mL). A negative result must be combined with clinical observations, patient history, and epidemiological information. The expected result is Negative.  Fact Sheet for Patients:  BloggerCourse.com  Fact Sheet for Healthcare Providers:  SeriousBroker.it  This test is no t yet approved or cleared by the [place]  FDA and  has been authorized for detection and/or diagnosis of SARS-CoV-2 by FDA under an Emergency Use Authorization (EUA). This EUA will remain  in effect (meaning this test can be used) for the duration of the COVID-19 declaration under Section 564(b)(1) of the Act, 21 U.S.C.section 360bbb-3(b)(1), unless the authorization is terminated  or revoked sooner.       Influenza A by PCR NEGATIVE NEGATIVE Final   Influenza B by PCR NEGATIVE NEGATIVE Final    Comment: (NOTE) The Xpert Xpress SARS-CoV-2/FLU/RSV plus assay is intended as an aid in the diagnosis of influenza from Nasopharyngeal swab specimens and should not be used as a sole basis for treatment. Nasal washings and aspirates are  unacceptable for Xpert Xpress SARS-CoV-2/FLU/RSV testing.  Fact Sheet for Patients: BloggerCourse.com  Fact Sheet for Healthcare Providers: SeriousBroker.it  This test is not yet approved or cleared by the [place]  FDA and has been authorized for detection and/or diagnosis of SARS-CoV-2 by FDA under an Emergency Use Authorization (EUA). This EUA will remain in effect (meaning this test can be used) for the duration of the COVID-19 declaration under Section 564(b)(1) of the Act, 21 U.S.C. section 360bbb-3(b)(1), unless the authorization is terminated or revoked.  Performed at [facility], [address], [place], [place] [address]   Urine Culture     Status: Abnormal   Collection Time: 05/24/21  3:50 PM   Specimen: Urine, Clean Catch  Result Value  Ref Range Status   Specimen Description   Final    URINE, CLEAN CATCH Performed at [facility], [address], [place], [place] [address]    Special Requests   Final    NONE Performed at [facility], [address] [place], [place] [address]    Culture (A)  Final    <10,000 COLONIES/mL INSIGNIFICANT GROWTH Performed at [facility], [address], [place], [place] [address]    Report Status 05/25/2021 FINAL  Final    Labs: CBC: Recent Labs  Lab 12/02/23 1950 12/02/23 1958 12/03/23 0141  WBC 7.2  --  7.1  HGB 10.9* 11.9* 10.8*  HCT 35.0* 35.0* 32.9*  MCV 98.9  --  95.6  PLT 123*  --  122*   Basic Metabolic Panel: Recent Labs  Lab 12/02/23 1950 12/02/23 1958 12/03/23 0141  NA 136 137 134*  K 3.7 3.8 3.5  CL 102 102 99  CO2 23  --  25  GLUCOSE 149* 151* 119*  BUN 11 13 11   CREATININE 0.70 0.60 0.60  CALCIUM  8.8*  --  8.5*   Liver Function Tests: Recent Labs  Lab 12/02/23 1950  AST 68*  ALT 33  ALKPHOS 156*  BILITOT 1.8*  PROT 6.1*  ALBUMIN 3.3*   CBG: No results for input(s):  GLUCAP in the last 168 hours.  Discharge time spent: 35 minutes.  Signed: Concepcion Riser, MD [facility] 12/03/2023

## 2023-12-03 NOTE — Consult Note (Signed)
 Cardiology Consultation   Patient ID: Carmen Hammond MRN: [id number]; DOB: 1928-05-04  Admit date: 12/02/2023 Date of Consult: 12/03/2023  PCP:  Sophronia Ozell BROCKS, MD   [facility] Providers Cardiologist:  Wilbert Bihari, MD        Patient Profile: Carmen Hammond is a 89 y.o. female with a hx of hypertension, hyperlipidemia, persistent atrial fibrillation on Eliquis , diastolic dysfunction (has documentation of heart failure but I am unable to see any clinical episodes of acute heart failure), bradycardia and prior CVA in 2023 who is being seen 12/03/2023 for the evaluation of elevated troponins and syncopy at the request of Concepcion Riser MD.  History of Present Illness: Carmen Hammond is a 89 year old female with prior cardiac history listed below.  The patient has been seen by cardiology at [facility] in the past.  She has a history of SVT and had a prior ablation about 9 years ago.  She still has atrial fibrillation and has previously been rate controlled with Cardizem and metoprolol .  Most recent echocardiogram was done on 05/2021 and showed an LVEF of 60 to 65%, mild LVH, G2 DD, no regional wall motion abnormalities, normal RV function, severely dilated left atrium, moderately dilated right atrium, trivial MR, and mild AS.  Patient wore a Holter monitor on 06/2021 showed patient's underlying rhythm was sinus and did not find any runs of atrial fibrillation.  1 run of SVT was found that lasted 3 beats.  At last follow-up on 07/2023 patient was felt to be bradycardic so her Cardizem was stopped.  A 30-day cardiac monitor was ordered.  I am unable to see any interpretation of this cardiac monitor.  Patient presented to the emergency department following an unwitnessed fall and loss of consciousness at home.  Patient's daughter Landry was present for the interview.  Patient and her daughter report that she lives with her 2 oldest sons.  Reported that the patient was in the bathroom  when she fell.  Following the fall the patient was unconscious for several minutes.  The patient was unable to recall how she fell but reported that she was fully clothed so suspected it happened after she used the bathroom.  Patient's daughter did report that she had worsening weakness in her legs of the last 3 to 4 months.  Patient's daughter also felt like she was more confused than usual prior to the fall.  Denies any chest pain, shortness of breath, fever, chills, diaphoresis, lower extremity edema, melena, hematochezia, and hematuria.  She is active and able to walk about 15 minutes using a walker on a daily basis.  Patient has been able to get up and walk since being in the hospital.  Labs showed a potassium of 3.5, creatinine of 0.60, calcium  of 8.5, sodium of 134, glucose of 119, elevated troponin of 52 > 218, elevated BNP of 307.7, new onset normocytic anemia with a hemoglobin of 10.8.  Chest x-ray showed aortic atherosclerosis, bronchovascular crowding, no lung volumes, and no other acute cardiopulmonary abnormality.  CT chest abdomen and pelvis showed cardiomegaly with trace pleural effusions  and associated interlobular septal thickening, consistent with mild pulmonary edema, enlargement of pulmonary arteries suggestive of pulmonary hypertension, and aortic atherosclerosis.  EKG showed atrial fibrillation with a heart rate of 100.  Past Medical History:  Diagnosis Date   Arthritis    Dental crowns present    Dysrhythmia 2012   cardiac ablation   Hypertension    Hypothyroidism    Wears glasses  Past Surgical History:  Procedure Laterality Date   ABLATION OF DYSRHYTHMIC FOCUS  04/01/2010   [facility]ERY  07/19/2017   aneurysm endovascular exclusion   CARPAL TUNNEL RELEASE Left 08/20/2013   Procedure: LEFT CARPAL TUNNEL RELEASE;  Surgeon: Arley JONELLE Curia, MD;  Location: [facility];  Service: Orthopedics;  Laterality: Left;   CESAREAN SECTION   [phone]   x4   EYE SURGERY     both cataracts   LUMBAR FUSION  04/02/2007   TOTAL HIP ARTHROPLASTY  04/01/2006   left   TOTAL HIP ARTHROPLASTY  04/01/2005   right     Home Medications:  Prior to Admission medications   Medication Sig Start Date End Date Taking? Authorizing Provider  aspirin  325 MG tablet Take 1 tablet (325 mg total) by mouth daily. 05/29/21   Krishnan, Sendil K, MD  atorvastatin  (LIPITOR ) 20 MG tablet Take 1 tablet (20 mg total) by mouth at bedtime. 05/28/21   Krishnan, Sendil K, MD  Biotin  5000 MCG TABS Take 5,000 mcg by mouth in the morning.    [provider]  cholecalciferol (VITAMIN D3) 25 MCG (1000 UNIT) tablet Take 1,000 Units by mouth daily with lunch.    [provider]  clopidogrel  (PLAVIX ) 75 MG tablet Take 1 tablet (75 mg total) by mouth daily. Patient not taking: Reported on 10/31/2021 05/29/21   Krishnan, Sendil K, MD  diltiazem (CARDIZEM CD) 120 MG 24 hr capsule Take 120 mg by mouth every morning. 03/27/21   [provider]  levothyroxine  (SYNTHROID ) 25 MCG tablet Take 25 mcg by mouth daily before breakfast. 03/27/21   [provider]  magnesium  30 MG tablet Take 1 tablet (30 mg total) by mouth 2 (two) times daily. 05/28/21   Krishnan, Sendil K, MD  metoprolol  tartrate (LOPRESSOR ) 50 MG tablet Take 50 mg by mouth 2 (two) times daily. 03/27/21   [provider]  Multiple Vitamin (MULTIVITAMIN WITH MINERALS) TABS tablet Take 1 tablet by mouth daily with lunch.    [provider]  traMADol  (ULTRAM ) 50 MG tablet Take 1 tablet (50 mg total) by mouth every 6 (six) hours as needed. Patient taking differently: Take 50 mg by mouth every 6 (six) hours as needed (pain). 08/20/13   Curia Arley, MD    Scheduled Meds:  apixaban   2.5 mg Oral BID   aspirin   325 mg Oral Daily   atorvastatin   20 mg Oral QHS   levothyroxine   25 mcg Oral Q0600   magnesium  oxide  400 mg Oral BID   metoprolol  tartrate  25 mg Oral BID    Continuous Infusions:  PRN Meds: acetaminophen  **OR** acetaminophen   Allergies:    Allergies  Allergen Reactions   Meperidine Hcl Nausea And Vomiting   Nitrofurantoin Nausea And Vomiting   Oxycodone-Acetaminophen  Other (See Comments)    confused     Sulfa Antibiotics Nausea And Vomiting    Social History:   Social History   Socioeconomic History   Marital status: Divorced    Spouse name: Not on file   Number of children: Not on file   Years of education: Not on file   Highest education level: Not on file  Occupational History   Not on file  Tobacco Use   Smoking status: Never   Smokeless tobacco: Not on file  Substance and Sexual Activity   Alcohol use: No   Drug use: No   Sexual activity: Not on file  Other Topics Concern  Not on file  Social History Narrative   Not on file   Social Drivers of Health   Financial Resource Strain: Low Risk  (10/09/2023)   Received [facility]   Overall Financial Resource Strain (CARDIA)    Difficulty of Paying Living Expenses: Not hard at all  Food Insecurity: No Food Insecurity (12/03/2023)   Hunger Vital Sign    Worried About Running Out of Food in the Last Year: Never true    Ran Out of Food in the Last Year: Never true  Transportation Needs: No Transportation Needs (12/03/2023)   PRAPARE - Administrator, Civil Service (Medical): No    Lack of Transportation (Non-Medical): No  Physical Activity: Sufficiently Active (10/09/2023)   Received from [facility]   Exercise Vital Sign    On average, how many days per week do you engage in moderate to strenuous exercise (like a brisk walk)?: 7 days    On average, how many minutes do you engage in exercise at this level?: 30 min  Stress: No Stress Concern Present (10/09/2023)   Received from [facility] - Occupational Stress Questionnaire    Feeling of Stress : Not at all  Social Connections: Moderately Isolated  (12/03/2023)   Social Connection and Isolation Panel    Frequency of Communication with Friends and Family: More than three times a week    Frequency of Social Gatherings with Friends and Family: More than three times a week    Attends Religious Services: More than 4 times per year    Active Member of [employer] or Organizations: No    Attends Banker Meetings: Never    Marital Status: Divorced  Catering manager Violence: Not At Risk (12/03/2023)   Humiliation, Afraid, Rape, and Kick questionnaire    Fear of Current or Ex-Partner: No    Emotionally Abused: No    Physically Abused: No    Sexually Abused: No    Family History:   History reviewed. No pertinent family history.   ROS:  Please see the history of present illness.   All other ROS reviewed and negative.     Physical Exam/Data: Vitals:   12/03/23 0111 12/03/23 0538 12/03/23 0823 12/03/23 1147  BP:  138/79 (!) 121/58 122/62  Pulse:  76 70 62  Resp:  17    Temp:  98.8 F (37.1 C) 98.2 F (36.8 C)   TempSrc:   Oral   SpO2:  99% 100% 100%  Height: 4' 11 (1.499 m)       Intake/Output Summary (Last 24 hours) at 12/03/2023 1206 Last data filed at 12/02/2023 1957 Gross per 24 hour  Intake 250 ml  Output --  Net 250 ml      10/31/2021    9:15 AM 05/24/2021   10:34 AM 08/16/2013    4:37 PM  Last 3 Weights  Weight (lbs) 149 lb 158 lb 164 lb  Weight (kg) 67.586 kg 71.668 kg 74.39 kg     Body mass index is 30.09 kg/m.  General:  Well nourished, well developed, in no acute distress, appearing younger than stated age.  On room air HEENT: normal Neck: no JVD Vascular: No carotid bruits; Distal pulses 2+ bilaterally Cardiac:  normal S1, S2; irregularly irregular rhythm; no murmur. Lungs:  clear to auscultation bilaterally, no wheezing, rhonchi or rales  Abd: soft, nontender, no hepatomegaly  Ext: no edema Musculoskeletal:  No deformities. Skin: warm and dry  Neuro:   no focal abnormalities noted Psych:  Normal  affect   EKG:  The EKG was personally reviewed and demonstrates:  atrial fibrillation with a heart rate of 100. Telemetry:  Telemetry was personally reviewed and demonstrates: Atrial fibrillation.  In the emergency department heart rates were initially in the 100s.  Currently in the patient's room heart rates are in the 50s to 80s.  Relevant CV Studies: Echo pending  Laboratory Data: High Sensitivity Troponin:   Recent Labs  Lab 12/02/23 1950 12/02/23 2152  TROPONINIHS 52* 218*     Chemistry Recent Labs  Lab 12/02/23 1950 12/02/23 1958 12/03/23 0141  NA 136 137 134*  K 3.7 3.8 3.5  CL 102 102 99  CO2 23  --  25  GLUCOSE 149* 151* 119*  BUN 11 13 11   CREATININE 0.70 0.60 0.60  CALCIUM  8.8*  --  8.5*  GFRNONAA >60  --  >60  ANIONGAP 11  --  10    Recent Labs  Lab 12/02/23 1950  PROT 6.1*  ALBUMIN 3.3*  AST 68*  ALT 33  ALKPHOS 156*  BILITOT 1.8*   Lipids No results for input(s): CHOL, TRIG, HDL, LABVLDL, LDLCALC, CHOLHDL in the last 168 hours.  Hematology Recent Labs  Lab 12/02/23 1950 12/02/23 1958 12/03/23 0141 12/03/23 0611  WBC 7.2  --  7.1  --   RBC 3.54*  --  3.44* 3.62*  HGB 10.9* 11.9* 10.8*  --   HCT 35.0* 35.0* 32.9*  --   MCV 98.9  --  95.6  --   MCH 30.8  --  31.4  --   MCHC 31.1  --  32.8  --   RDW 15.1  --  15.0  --   PLT 123*  --  122*  --    Thyroid  No results for input(s): TSH, FREET4 in the last 168 hours.  BNP Recent Labs  Lab 12/02/23 1950  BNP 307.7*    DDimer No results for input(s): DDIMER in the last 168 hours.  Radiology/Studies:  CT HEAD WO CONTRAST Result Date: 12/02/2023 CLINICAL DATA:  Trauma, fall EXAM: CT HEAD, FACIAL BONES AND CERVICAL SPINE WITHOUT CONTRAST CT CHEST, ABDOMEN, AND PELVIS WITH CONTRAST TECHNIQUE: Multi detector CT imaging of the head, facial bones, and cervical spine was performed without intravenous contrast. Multidetector CT imaging of the chest, abdomen and pelvis was performed  following the standard protocol during bolus administration of intravenous contrast. RADIATION DOSE REDUCTION: This exam was performed according to the departmental dose-optimization program which includes automated exposure control, adjustment of the mA and/or kV according to patient size and/or use of iterative reconstruction technique. CONTRAST:  55mL OMNIPAQUE  IOHEXOL  350 MG/ML SOLN COMPARISON:  10/31/2021 FINDINGS: CT HEAD FINDINGS Brain: No evidence of acute infarction, hemorrhage, hydrocephalus, extra-axial collection or mass lesion/mass effect. Right anterior MCA territory encephalomalacia. Vascular: No hyperdense vessel or unexpected calcification. CT FACIAL BONES FINDINGS Skull: Normal. Negative for fracture or focal lesion. Facial bones: No displaced fractures or dislocations. Sinuses/Orbits: No acute finding. Other: Dense cement and/or coil material in the left suboccipital soft tissues. CT CERVICAL SPINE FINDINGS Alignment: Degenerative straightening of the normal cervical lordosis. Degenerative anterolisthesis of C3 on C4. Skull base and vertebrae: No acute fracture. No primary bone lesion or focal pathologic process. Soft tissues and spinal canal: No prevertebral fluid or swelling. No visible canal hematoma. Disc levels: Moderate multilevel cervical disc degenerative disease, worst from C4-C7. Other: None. CT CHEST FINDINGS Cardiovascular: Aortic atherosclerosis. Cardiomegaly. Enlargement of  the main pulmonary artery measuring up to 3.8 cm in caliber. No pericardial effusion. Mediastinum/Nodes: No enlarged mediastinal, hilar, or axillary lymph nodes. Thyroid  gland, trachea, and esophagus demonstrate no significant findings. Lungs/Pleura: Trace pleural effusions and associated interlobular septal thickening. Dependent bibasilar scarring or atelectasis. Musculoskeletal: No chest wall abnormality. No acute osseous findings. CT ABDOMEN PELVIS FINDINGS Hepatobiliary: No solid liver abnormality is seen. No  gallstones, gallbladder wall thickening, or biliary dilatation. Pancreas: Unremarkable. No pancreatic ductal dilatation or surrounding inflammatory changes. Spleen: Normal in size without significant abnormality. Adrenals/Urinary Tract: Adrenal glands are unremarkable. Kidneys are normal, without renal calculi, solid lesion, or hydronephrosis. Bladder is unremarkable. Stomach/Bowel: Stomach is within normal limits. Appendix appears normal. No evidence of bowel wall thickening, distention, or inflammatory changes. Vascular/Lymphatic: Aortic atherosclerosis. No enlarged abdominal or pelvic lymph nodes. Reproductive: Calcified uterine fibroids. Air within the fundal endometrial cavity. Other: No abdominal wall hernia or abnormality. No ascites. Musculoskeletal: No acute osseous findings. Status post bilateral hip total arthroplasty with dense metallic streak artifact. IMPRESSION: 1. No acute intracranial pathology. Right anterior MCA territory encephalomalacia. 2. No displaced fractures or dislocations of the facial bones. 3. No fracture or static subluxation of the cervical spine. 4. No CT evidence of acute traumatic injury to the chest, abdomen, or pelvis. 5. Cardiomegaly. Trace pleural effusions and associated interlobular septal thickening, consistent with mild pulmonary edema. 6. Enlargement of the main pulmonary artery, as can be seen in pulmonary hypertension. 7. Calcified uterine fibroids. Air within the fundal endometrial cavity, of uncertain significance, possibly related to prior instrumentation or surgery although of doubtful acute clinical significance. Aortic Atherosclerosis (ICD10-I70.0). Electronically Signed   By: Marolyn JONETTA Jaksch M.D.   On: 12/02/2023 21:35   CT CERVICAL SPINE WO CONTRAST Result Date: 12/02/2023 CLINICAL DATA:  Trauma, fall EXAM: CT HEAD, FACIAL BONES AND CERVICAL SPINE WITHOUT CONTRAST CT CHEST, ABDOMEN, AND PELVIS WITH CONTRAST TECHNIQUE: Multi detector CT imaging of the head, facial  bones, and cervical spine was performed without intravenous contrast. Multidetector CT imaging of the chest, abdomen and pelvis was performed following the standard protocol during bolus administration of intravenous contrast. RADIATION DOSE REDUCTION: This exam was performed according to the departmental dose-optimization program which includes automated exposure control, adjustment of the mA and/or kV according to patient size and/or use of iterative reconstruction technique. CONTRAST:  55mL OMNIPAQUE  IOHEXOL  350 MG/ML SOLN COMPARISON:  10/31/2021 FINDINGS: CT HEAD FINDINGS Brain: No evidence of acute infarction, hemorrhage, hydrocephalus, extra-axial collection or mass lesion/mass effect. Right anterior MCA territory encephalomalacia. Vascular: No hyperdense vessel or unexpected calcification. CT FACIAL BONES FINDINGS Skull: Normal. Negative for fracture or focal lesion. Facial bones: No displaced fractures or dislocations. Sinuses/Orbits: No acute finding. Other: Dense cement and/or coil material in the left suboccipital soft tissues. CT CERVICAL SPINE FINDINGS Alignment: Degenerative straightening of the normal cervical lordosis. Degenerative anterolisthesis of C3 on C4. Skull base and vertebrae: No acute fracture. No primary bone lesion or focal pathologic process. Soft tissues and spinal canal: No prevertebral fluid or swelling. No visible canal hematoma. Disc levels: Moderate multilevel cervical disc degenerative disease, worst from C4-C7. Other: None. CT CHEST FINDINGS Cardiovascular: Aortic atherosclerosis. Cardiomegaly. Enlargement of the main pulmonary artery measuring up to 3.8 cm in caliber. No pericardial effusion. Mediastinum/Nodes: No enlarged mediastinal, hilar, or axillary lymph nodes. Thyroid  gland, trachea, and esophagus demonstrate no significant findings. Lungs/Pleura: Trace pleural effusions and associated interlobular septal thickening. Dependent bibasilar scarring or atelectasis.  Musculoskeletal: No chest wall abnormality. No acute osseous findings. CT  ABDOMEN PELVIS FINDINGS Hepatobiliary: No solid liver abnormality is seen. No gallstones, gallbladder wall thickening, or biliary dilatation. Pancreas: Unremarkable. No pancreatic ductal dilatation or surrounding inflammatory changes. Spleen: Normal in size without significant abnormality. Adrenals/Urinary Tract: Adrenal glands are unremarkable. Kidneys are normal, without renal calculi, solid lesion, or hydronephrosis. Bladder is unremarkable. Stomach/Bowel: Stomach is within normal limits. Appendix appears normal. No evidence of bowel wall thickening, distention, or inflammatory changes. Vascular/Lymphatic: Aortic atherosclerosis. No enlarged abdominal or pelvic lymph nodes. Reproductive: Calcified uterine fibroids. Air within the fundal endometrial cavity. Other: No abdominal wall hernia or abnormality. No ascites. Musculoskeletal: No acute osseous findings. Status post bilateral hip total arthroplasty with dense metallic streak artifact. IMPRESSION: 1. No acute intracranial pathology. Right anterior MCA territory encephalomalacia. 2. No displaced fractures or dislocations of the facial bones. 3. No fracture or static subluxation of the cervical spine. 4. No CT evidence of acute traumatic injury to the chest, abdomen, or pelvis. 5. Cardiomegaly. Trace pleural effusions and associated interlobular septal thickening, consistent with mild pulmonary edema. 6. Enlargement of the main pulmonary artery, as can be seen in pulmonary hypertension. 7. Calcified uterine fibroids. Air within the fundal endometrial cavity, of uncertain significance, possibly related to prior instrumentation or surgery although of doubtful acute clinical significance. Aortic Atherosclerosis (ICD10-I70.0). Electronically Signed   By: Marolyn JONETTA Jaksch M.D.   On: 12/02/2023 21:35   CT CHEST ABDOMEN PELVIS W CONTRAST Result Date: 12/02/2023 CLINICAL DATA:  Trauma, fall EXAM:  CT HEAD, FACIAL BONES AND CERVICAL SPINE WITHOUT CONTRAST CT CHEST, ABDOMEN, AND PELVIS WITH CONTRAST TECHNIQUE: Multi detector CT imaging of the head, facial bones, and cervical spine was performed without intravenous contrast. Multidetector CT imaging of the chest, abdomen and pelvis was performed following the standard protocol during bolus administration of intravenous contrast. RADIATION DOSE REDUCTION: This exam was performed according to the departmental dose-optimization program which includes automated exposure control, adjustment of the mA and/or kV according to patient size and/or use of iterative reconstruction technique. CONTRAST:  55mL OMNIPAQUE  IOHEXOL  350 MG/ML SOLN COMPARISON:  10/31/2021 FINDINGS: CT HEAD FINDINGS Brain: No evidence of acute infarction, hemorrhage, hydrocephalus, extra-axial collection or mass lesion/mass effect. Right anterior MCA territory encephalomalacia. Vascular: No hyperdense vessel or unexpected calcification. CT FACIAL BONES FINDINGS Skull: Normal. Negative for fracture or focal lesion. Facial bones: No displaced fractures or dislocations. Sinuses/Orbits: No acute finding. Other: Dense cement and/or coil material in the left suboccipital soft tissues. CT CERVICAL SPINE FINDINGS Alignment: Degenerative straightening of the normal cervical lordosis. Degenerative anterolisthesis of C3 on C4. Skull base and vertebrae: No acute fracture. No primary bone lesion or focal pathologic process. Soft tissues and spinal canal: No prevertebral fluid or swelling. No visible canal hematoma. Disc levels: Moderate multilevel cervical disc degenerative disease, worst from C4-C7. Other: None. CT CHEST FINDINGS Cardiovascular: Aortic atherosclerosis. Cardiomegaly. Enlargement of the main pulmonary artery measuring up to 3.8 cm in caliber. No pericardial effusion. Mediastinum/Nodes: No enlarged mediastinal, hilar, or axillary lymph nodes. Thyroid  gland, trachea, and esophagus demonstrate no  significant findings. Lungs/Pleura: Trace pleural effusions and associated interlobular septal thickening. Dependent bibasilar scarring or atelectasis. Musculoskeletal: No chest wall abnormality. No acute osseous findings. CT ABDOMEN PELVIS FINDINGS Hepatobiliary: No solid liver abnormality is seen. No gallstones, gallbladder wall thickening, or biliary dilatation. Pancreas: Unremarkable. No pancreatic ductal dilatation or surrounding inflammatory changes. Spleen: Normal in size without significant abnormality. Adrenals/Urinary Tract: Adrenal glands are unremarkable. Kidneys are normal, without renal calculi, solid lesion, or hydronephrosis. Bladder is unremarkable. Stomach/Bowel: Stomach  is within normal limits. Appendix appears normal. No evidence of bowel wall thickening, distention, or inflammatory changes. Vascular/Lymphatic: Aortic atherosclerosis. No enlarged abdominal or pelvic lymph nodes. Reproductive: Calcified uterine fibroids. Air within the fundal endometrial cavity. Other: No abdominal wall hernia or abnormality. No ascites. Musculoskeletal: No acute osseous findings. Status post bilateral hip total arthroplasty with dense metallic streak artifact. IMPRESSION: 1. No acute intracranial pathology. Right anterior MCA territory encephalomalacia. 2. No displaced fractures or dislocations of the facial bones. 3. No fracture or static subluxation of the cervical spine. 4. No CT evidence of acute traumatic injury to the chest, abdomen, or pelvis. 5. Cardiomegaly. Trace pleural effusions and associated interlobular septal thickening, consistent with mild pulmonary edema. 6. Enlargement of the main pulmonary artery, as can be seen in pulmonary hypertension. 7. Calcified uterine fibroids. Air within the fundal endometrial cavity, of uncertain significance, possibly related to prior instrumentation or surgery although of doubtful acute clinical significance. Aortic Atherosclerosis (ICD10-I70.0). Electronically  Signed   By: Marolyn JONETTA Jaksch M.D.   On: 12/02/2023 21:35   CT Maxillofacial Wo Contrast Result Date: 12/02/2023 CLINICAL DATA:  Trauma, fall EXAM: CT HEAD, FACIAL BONES AND CERVICAL SPINE WITHOUT CONTRAST CT CHEST, ABDOMEN, AND PELVIS WITH CONTRAST TECHNIQUE: Multi detector CT imaging of the head, facial bones, and cervical spine was performed without intravenous contrast. Multidetector CT imaging of the chest, abdomen and pelvis was performed following the standard protocol during bolus administration of intravenous contrast. RADIATION DOSE REDUCTION: This exam was performed according to the departmental dose-optimization program which includes automated exposure control, adjustment of the mA and/or kV according to patient size and/or use of iterative reconstruction technique. CONTRAST:  55mL OMNIPAQUE  IOHEXOL  350 MG/ML SOLN COMPARISON:  10/31/2021 FINDINGS: CT HEAD FINDINGS Brain: No evidence of acute infarction, hemorrhage, hydrocephalus, extra-axial collection or mass lesion/mass effect. Right anterior MCA territory encephalomalacia. Vascular: No hyperdense vessel or unexpected calcification. CT FACIAL BONES FINDINGS Skull: Normal. Negative for fracture or focal lesion. Facial bones: No displaced fractures or dislocations. Sinuses/Orbits: No acute finding. Other: Dense cement and/or coil material in the left suboccipital soft tissues. CT CERVICAL SPINE FINDINGS Alignment: Degenerative straightening of the normal cervical lordosis. Degenerative anterolisthesis of C3 on C4. Skull base and vertebrae: No acute fracture. No primary bone lesion or focal pathologic process. Soft tissues and spinal canal: No prevertebral fluid or swelling. No visible canal hematoma. Disc levels: Moderate multilevel cervical disc degenerative disease, worst from C4-C7. Other: None. CT CHEST FINDINGS Cardiovascular: Aortic atherosclerosis. Cardiomegaly. Enlargement of the main pulmonary artery measuring up to 3.8 cm in caliber. No  pericardial effusion. Mediastinum/Nodes: No enlarged mediastinal, hilar, or axillary lymph nodes. Thyroid  gland, trachea, and esophagus demonstrate no significant findings. Lungs/Pleura: Trace pleural effusions and associated interlobular septal thickening. Dependent bibasilar scarring or atelectasis. Musculoskeletal: No chest wall abnormality. No acute osseous findings. CT ABDOMEN PELVIS FINDINGS Hepatobiliary: No solid liver abnormality is seen. No gallstones, gallbladder wall thickening, or biliary dilatation. Pancreas: Unremarkable. No pancreatic ductal dilatation or surrounding inflammatory changes. Spleen: Normal in size without significant abnormality. Adrenals/Urinary Tract: Adrenal glands are unremarkable. Kidneys are normal, without renal calculi, solid lesion, or hydronephrosis. Bladder is unremarkable. Stomach/Bowel: Stomach is within normal limits. Appendix appears normal. No evidence of bowel wall thickening, distention, or inflammatory changes. Vascular/Lymphatic: Aortic atherosclerosis. No enlarged abdominal or pelvic lymph nodes. Reproductive: Calcified uterine fibroids. Air within the fundal endometrial cavity. Other: No abdominal wall hernia or abnormality. No ascites. Musculoskeletal: No acute osseous findings. Status post bilateral hip total arthroplasty with  dense metallic streak artifact. IMPRESSION: 1. No acute intracranial pathology. Right anterior MCA territory encephalomalacia. 2. No displaced fractures or dislocations of the facial bones. 3. No fracture or static subluxation of the cervical spine. 4. No CT evidence of acute traumatic injury to the chest, abdomen, or pelvis. 5. Cardiomegaly. Trace pleural effusions and associated interlobular septal thickening, consistent with mild pulmonary edema. 6. Enlargement of the main pulmonary artery, as can be seen in pulmonary hypertension. 7. Calcified uterine fibroids. Air within the fundal endometrial cavity, of uncertain significance,  possibly related to prior instrumentation or surgery although of doubtful acute clinical significance. Aortic Atherosclerosis (ICD10-I70.0). Electronically Signed   By: Marolyn JONETTA Jaksch M.D.   On: 12/02/2023 21:35   DG Chest Port 1 View Result Date: 12/02/2023 CLINICAL DATA:  Trauma EXAM: PORTABLE CHEST - 1 VIEW COMPARISON:  January 06, 2006 FINDINGS: Lower lung volumes with bronchovascular crowding. No focal airspace consolidation, pleural effusion, or pneumothorax. Mild cardiomegaly. Tortuous aorta with aortic atherosclerosis. No acute fracture or destructive lesions. Multilevel thoracic osteophytosis. IMPRESSION: Low lung volumes.  Otherwise, no acute cardiopulmonary abnormality. Electronically Signed   By: Rogelia Myers M.D.   On: 12/02/2023 20:29   DG Femur Portable Min 2 Views Right Result Date: 12/02/2023 CLINICAL DATA:  Left leg pain EXAM: RIGHT FEMUR PORTABLE 2 VIEW COMPARISON:  None Available. FINDINGS: Left hip arthroplasty appears anatomically aligned.No acute fracture or dislocation. No periprosthetic lucency to suggest loosening. Osteoarthritis of the knee. Diffuse peripheral vascular atherosclerosis. IMPRESSION: No acute fracture or dislocation status post left hip arthroplasty. Electronically Signed   By: Rogelia Myers M.D.   On: 12/02/2023 20:28   DG Pelvis 1-2 Views Result Date: 12/02/2023 CLINICAL DATA:  [id number] Hip pain [id number] EXAM: PELVIS - 1-2 VIEW COMPARISON:  None Available. FINDINGS: Diffuse osteopenia. Bilateral hip arthroplasties appear anatomically aligned without dislocation.Cortical irregularity along the pelvic rim at the right puboacetabular junction.No acute hip fracture or dislocation.Lumbosacral fusion hardware at L5-S1 with disc replacement.Soft tissues are unremarkable. IMPRESSION: Diffuse osteopenia. Cortical irregularity along the pelvic rim at the right puboacetabular junction, worrisome for mildly displaced fracture. A follow-up pelvic CT is recommended for further  characterization. Electronically Signed   By: Rogelia Myers M.D.   On: 12/02/2023 20:27     Assessment and Plan:  Carmen Hammond is a 89 y.o. female with a hx of hypertension, hyperlipidemia, persistent atrial fibrillation on Eliquis , diastolic dysfunction, bradycardia and prior CVA in 2023 who is being seen 12/03/2023 for the evaluation of elevated troponins and syncopy at the request of Concepcion Riser MD.  Syncope Patient had an unwitnessed fall that happened while she was in the bathroom.  Following the fall the patient was unconscious for several minutes.  The patient was unable to recall how she fell but reported that she was fully clothed so suspected it happened after she used the bathroom.  With patient's daughter reporting that she has had worsening leg weakness and is been more confused than usual recently my suspicion is low that the fall was cardiac in nature. Echo pending.   Persistent atrial fibrillation CHA2DS2-VASc Score = 8 [CHF History: 1, HTN History: 1, Diabetes History: 0, Stroke History: 2, Vascular Disease History: 1, Age Score: 2, Gender Score: 1].  Therefore, the patient's annual risk of stroke is 10.8 %. Telemetry shows patient has been in artrial fibrillation. Heart rates are currently well controled in the 50's to 80's. Continue Eliquis  2.5 mg twice daily Continue metoprolol  25 mg twice daily   Elevated  troponins 52 > 218 Hyperlipidemia Denies any chest pain, shortness of breath, or diaphoresis.  She is active and able to walk about 15 minutes using a walker on a daily basis.  Denies any recent decline in functional ability or any worsening shortness of breath or chest pain when walking. Received 324mg  aspirin . Suspect this is secondary to demand ischemia from syncope or increased heart rates in the ER.  Assuming there are no concerning findings on echo no further ischemic evaluation is necessary at this time. CT chest abdomen and pelvis showed cardiomegaly  with trace pleural effusions  and associated interlobular septal thickening, consistent with mild pulmonary edema, enlargement of pulmonary arteries suggestive of pulmonary hypertension, and aortic atherosclerosis. Echo pending Continue atorvastatin  20 mg daily   CVA Had CVA in 2023 Patient and her daughter reported that she takes a daily aspirin  81mg .  Suspect this may be related to her prior CVA in 2023. Reasonable to consider stopping aspirin  given patient is on Eliquis  for atrial fibrillation.   Anemia Hemoglobin 10.8.  Prior hemoglobin on 09/2023 was 12.3 Reasonable to consider FOBT to evaluate for GI bleed given patient is on aspirin  an eliquis  at home.   Otherwise managed per primary.    Risk Assessment/Risk Scores:        CHA2DS2-VASc Score = 8   This indicates a 10.8% annual risk of stroke. The patient's score is based upon: CHF History: 1 HTN History: 1 Diabetes History: 0 Stroke History: 2 Vascular Disease History: 1 Age Score: 2 Gender Score: 1       For questions or updates, please contact [facility] Please consult [email or web address] for contact info under    Signed, Morse Clause, PA-C  12/03/2023 12:06 PM

## 2023-12-03 NOTE — Progress Notes (Signed)
 Transition of Care [facility]) - Inpatient Brief Assessment   Patient Details  Name: Carmen Hammond MRN: [id number] Date of Birth: Jun 12, 1928  [facility]) CM/SW Contact:    Rosaline JONELLE Joe, RN Phone Number: 12/03/2023, 11:52 AM   Clinical Narrative: Patient admitted to the hospital with Syncopal episode and fall in the bathroom at home.  Hematoma noted on the patient's forehead.  Patient lives at a townhouse with her two sons.  DME at the home includes [place], WC, RW, elevated toilet seat, rollator.  No IP care needs at this time.   Transition of Care Asessment: Insurance and Status: (P) Insurance coverage has been reviewed Patient has primary care physician: (P) Yes Home environment has been reviewed: (P) from home with sons Prior level of function:: (P) self Prior/Current Home Services: (P) Current home services (DME at the home includes [facility], WC, RW, rollator, elevated toilet seat) Social Drivers of Health Review: (P) SDOH reviewed no interventions necessary Readmission risk has been reviewed: (P) Yes Transition of care needs: (P) no transition of care needs at this time

## 2023-12-03 NOTE — TOC Progression Note (Signed)
 Transition of Care [facility]) - Progression Note    Patient Details  Name: Carmen Hammond MRN: [id number] Date of Birth: 06/08/1928  Transition of Care [facility]) CM/SW Contact  Rosaline JONELLE Joe, RN Phone Number: 12/03/2023, 3:46 PM  Clinical Narrative:    CM met with the patient to offer Medicare choice regarding home health and patient chose [facility].  HH order for PT placed to be co-signed by MD.  No other IP Care management needs and patient's daughter will provide transportation to home by car.                     Expected Discharge Plan and Services         Expected Discharge Date: 12/03/23                                     Social Drivers of Health (SDOH) Interventions SDOH Screenings   Food Insecurity: No Food Insecurity (12/03/2023)  Housing: Low Risk  (12/03/2023)  Transportation Needs: No Transportation Needs (12/03/2023)  Utilities: Not At Risk (12/03/2023)  Financial Resource Strain: Low Risk  (10/09/2023)   Received from [facility]  Physical Activity: Sufficiently Active (10/09/2023)   Received from [facility]  Social Connections: Moderately Isolated (12/03/2023)  Stress: No Stress Concern Present (10/09/2023)   Received from [facility]  Tobacco Use: Unknown (12/02/2023)    Readmission Risk Interventions     No data to display

## 2023-12-03 NOTE — Care Management Obs Status (Signed)
 MEDICARE OBSERVATION STATUS NOTIFICATION   Patient Details  Name: Carmen Hammond MRN: [id number] Date of Birth: September 11, 1928   Medicare Observation Status Notification Given:  Yes  Obs notice signed and copy given   Claretta Deed 12/03/2023, 1:37 PM

## 2023-12-04 LAB — CG4 I-STAT (LACTIC ACID): Lactic Acid, Venous: 2.6 mmol/L (ref 0.5–1.9)

## 2024-05-02 DEATH — deceased
